# Patient Record
Sex: Female | Born: 1975 | Race: Black or African American | Hispanic: No | Smoking: Former smoker
Health system: Southern US, Community
[De-identification: ages and names within clinical notes are randomized; demographics above are authoritative.]

## PROBLEM LIST (undated history)

## (undated) DIAGNOSIS — G43909 Migraine, unspecified, not intractable, without status migrainosus: Secondary | ICD-10-CM

## (undated) DIAGNOSIS — G5600 Carpal tunnel syndrome, unspecified upper limb: Secondary | ICD-10-CM

## (undated) DIAGNOSIS — Z524 Kidney donor: Secondary | ICD-10-CM

## (undated) DIAGNOSIS — F32A Depression, unspecified: Secondary | ICD-10-CM

## (undated) DIAGNOSIS — I890 Lymphedema, not elsewhere classified: Secondary | ICD-10-CM

## (undated) DIAGNOSIS — C50919 Malignant neoplasm of unspecified site of unspecified female breast: Secondary | ICD-10-CM

## (undated) DIAGNOSIS — G4700 Insomnia, unspecified: Secondary | ICD-10-CM

## (undated) DIAGNOSIS — T7840XA Allergy, unspecified, initial encounter: Secondary | ICD-10-CM

## (undated) DIAGNOSIS — F329 Major depressive disorder, single episode, unspecified: Secondary | ICD-10-CM

## (undated) DIAGNOSIS — Z1379 Encounter for other screening for genetic and chromosomal anomalies: Secondary | ICD-10-CM

## (undated) DIAGNOSIS — F419 Anxiety disorder, unspecified: Secondary | ICD-10-CM

## (undated) HISTORY — PX: KIDNEY DONATION: SHX685

## (undated) HISTORY — DX: Migraine, unspecified, not intractable, without status migrainosus: G43.909

## (undated) HISTORY — PX: COLONOSCOPY: SHX174

## (undated) HISTORY — DX: Encounter for other screening for genetic and chromosomal anomalies: Z13.79

## (undated) HISTORY — DX: Insomnia, unspecified: G47.00

---

## 1998-04-29 ENCOUNTER — Other Ambulatory Visit: Admission: RE | Admit: 1998-04-29 | Discharge: 1998-04-29 | Payer: Self-pay | Admitting: Oral Surgery

## 1999-07-15 ENCOUNTER — Other Ambulatory Visit: Admission: RE | Admit: 1999-07-15 | Discharge: 1999-07-15 | Payer: Self-pay | Admitting: *Deleted

## 2000-10-03 ENCOUNTER — Other Ambulatory Visit: Admission: RE | Admit: 2000-10-03 | Discharge: 2000-10-03 | Payer: Self-pay | Admitting: Obstetrics and Gynecology

## 2003-07-11 HISTORY — PX: KIDNEY DONATION: SHX685

## 2008-02-03 ENCOUNTER — Encounter: Admission: RE | Admit: 2008-02-03 | Discharge: 2008-02-03 | Payer: Self-pay | Admitting: Family Medicine

## 2008-07-16 ENCOUNTER — Encounter: Admission: RE | Admit: 2008-07-16 | Discharge: 2008-07-16 | Payer: Self-pay | Admitting: Family Medicine

## 2008-08-26 ENCOUNTER — Encounter: Admission: RE | Admit: 2008-08-26 | Discharge: 2008-08-26 | Payer: Self-pay | Admitting: Family Medicine

## 2008-09-17 ENCOUNTER — Emergency Department (HOSPITAL_COMMUNITY): Admission: EM | Admit: 2008-09-17 | Discharge: 2008-09-17 | Payer: Self-pay | Admitting: Family Medicine

## 2008-09-20 ENCOUNTER — Emergency Department (HOSPITAL_COMMUNITY): Admission: EM | Admit: 2008-09-20 | Discharge: 2008-09-20 | Payer: Self-pay | Admitting: Family Medicine

## 2009-02-02 ENCOUNTER — Encounter: Admission: RE | Admit: 2009-02-02 | Discharge: 2009-02-02 | Payer: Self-pay | Admitting: Family Medicine

## 2012-02-20 ENCOUNTER — Encounter: Payer: Self-pay | Admitting: Obstetrics and Gynecology

## 2012-02-20 ENCOUNTER — Ambulatory Visit (INDEPENDENT_AMBULATORY_CARE_PROVIDER_SITE_OTHER): Payer: 59 | Admitting: Obstetrics and Gynecology

## 2012-02-20 VITALS — BP 110/78 | Wt 153.0 lb

## 2012-02-20 DIAGNOSIS — Z309 Encounter for contraceptive management, unspecified: Secondary | ICD-10-CM

## 2012-02-20 DIAGNOSIS — N898 Other specified noninflammatory disorders of vagina: Secondary | ICD-10-CM

## 2012-02-20 DIAGNOSIS — IMO0001 Reserved for inherently not codable concepts without codable children: Secondary | ICD-10-CM

## 2012-02-20 LAB — POCT WET PREP (WET MOUNT)
Clue Cells Wet Prep Whiff POC: NEGATIVE
pH: 4.5

## 2012-02-20 MED ORDER — FLUCONAZOLE 150 MG PO TABS: 150.0000 mg | ORAL_TABLET | Freq: Every day | ORAL | Status: AC

## 2012-02-20 MED ORDER — METRONIDAZOLE 500 MG PO TABS
500.0000 mg | ORAL_TABLET | Freq: Two times a day (BID) | ORAL | Status: AC
Start: 2012-02-20 — End: 2012-03-01

## 2012-02-20 MED ORDER — ETONOGESTREL-ETHINYL ESTRADIOL 0.12-0.015 MG/24HR VA RING: VAGINAL_RING | VAGINAL | Status: DC

## 2012-02-20 NOTE — Progress Notes (Signed)
Color: creamy   Odor: yes Itching:no Thin:yes Thick:yes Fever:no Dyspareunia:no Hx PID:no HX STD:yes Pelvic Pain:no Desires Gc/CT:yes Desires HIV,RPR,HbsAG:yes  HISTORY OF PRESENT ILLNESS  Kimberly Maldonado is a 36 y.o. year old female,G3P1021, who presents for a problem visit. She was to rule out sexual transmitted infections.  She uses the NuvaRing for contraception.  She says that she frequently gets yeast infections after antibiotics.  Subjective:  The patient complains of a vaginal discharge with an odor.  Objective:  BP 110/78  Wt 153 lb (69.4 kg)  LMP 02/07/2012   General: no distress GI: soft and nontender, no masses  External genitalia: normal general appearance Vaginal: normal without tenderness, induration or masses Cervix: normal appearance Adnexa: normal bimanual exam Uterus: upper limits normal size  Wet prep: PH 4.5, questionable clue cells,whiff negative  Assessment:  Vaginal discharge with an odor Contraception  Plan:  Diflucan 150 mg (patient's request). Metronidazole 500 mg twice each day for 7 days NuvaRing until annual exam GC and Chlamydia sent (patient declines blood work)  Return to office in 2 month(s) for her annual exam.   Leonard Schwartz M.D.  02/20/2012 6:24 PM

## 2012-02-21 ENCOUNTER — Encounter: Payer: Self-pay | Admitting: Obstetrics and Gynecology

## 2012-02-21 LAB — GC/CHLAMYDIA PROBE AMP, GENITAL
Chlamydia, DNA Probe: NEGATIVE
GC Probe Amp, Genital: NEGATIVE

## 2012-02-28 ENCOUNTER — Telehealth: Payer: Self-pay | Admitting: Obstetrics and Gynecology

## 2012-02-29 ENCOUNTER — Ambulatory Visit (INDEPENDENT_AMBULATORY_CARE_PROVIDER_SITE_OTHER): Payer: 59 | Admitting: Obstetrics and Gynecology

## 2012-02-29 ENCOUNTER — Encounter: Payer: Self-pay | Admitting: Obstetrics and Gynecology

## 2012-02-29 VITALS — BP 100/78 | Wt 149.0 lb

## 2012-02-29 DIAGNOSIS — F439 Reaction to severe stress, unspecified: Secondary | ICD-10-CM | POA: Insufficient documentation

## 2012-02-29 DIAGNOSIS — Z639 Problem related to primary support group, unspecified: Secondary | ICD-10-CM

## 2012-02-29 DIAGNOSIS — N898 Other specified noninflammatory disorders of vagina: Secondary | ICD-10-CM

## 2012-02-29 MED ORDER — AMBULATORY NON FORMULARY MEDICATION: 600.0000 mg | Status: DC

## 2012-02-29 NOTE — Progress Notes (Signed)
Color: creamy white Odor: no Itching:no Thin:no Thick:yes Fever:no Dyspareunia:yes Hx PID:no HX STD:no Pelvic Pain:no Desires Gc/CT:no Desires HIV,RPR,HbsAG:no  HISTORY OF PRESENT ILLNESS  Ms. Kimberly Maldonado is a 36 y.o. year old female,G3P1021, who presents for a problem visit. Gonorrhea and Chlamydia were negative recently.  Subjective:  The patient complains of continued vaginal discharge, dyspareunia, and emotional distress because her husband has left home.  Objective:  BP 100/78  Wt 149 lb (67.586 kg)  LMP 02/07/2012   General: moderate distress Resp: clear to auscultation bilaterally  External genitalia: normal general appearance Vaginal: normal without tenderness, induration or masses and NuvaRing present.  Thin discharge only. Cervix: normal appearance Adnexa: normal bimanual exam Uterus: upper limits normal size  Ossum test is negative for yeast, vaginosis, and trichomoniasis.  Assessment:  Vaginal discharge of uncertain etiology. Stress associated with separation from husband. Dyspareunia.  Plan:  Boric Acid suppositories 600 mg twice each week. Support offered. Counseling recommended.  Return to office prn if symptoms worsen or fail to improve.   Leonard Schwartz M.D.  02/29/2012 6:30 PM

## 2012-03-06 ENCOUNTER — Ambulatory Visit: Payer: 59 | Admitting: Obstetrics and Gynecology

## 2012-08-12 ENCOUNTER — Other Ambulatory Visit: Payer: Self-pay | Admitting: Obstetrics and Gynecology

## 2012-08-26 ENCOUNTER — Encounter: Payer: Self-pay | Admitting: Obstetrics and Gynecology

## 2012-08-26 ENCOUNTER — Ambulatory Visit: Payer: 59 | Admitting: Obstetrics and Gynecology

## 2012-08-26 VITALS — BP 90/62 | HR 62 | Ht 67.0 in | Wt 157.0 lb

## 2012-08-26 DIAGNOSIS — IMO0001 Reserved for inherently not codable concepts without codable children: Secondary | ICD-10-CM

## 2012-08-26 DIAGNOSIS — Z01419 Encounter for gynecological examination (general) (routine) without abnormal findings: Secondary | ICD-10-CM

## 2012-08-26 DIAGNOSIS — Z124 Encounter for screening for malignant neoplasm of cervix: Secondary | ICD-10-CM

## 2012-08-26 LAB — POCT URINE PREGNANCY: Preg Test, Ur: NEGATIVE

## 2012-08-26 MED ORDER — ETONOGESTREL-ETHINYL ESTRADIOL 0.12-0.015 MG/24HR VA RING: VAGINAL_RING | VAGINAL | Status: DC

## 2012-08-26 NOTE — Progress Notes (Signed)
Subjective:    Cotina Freedman is a 37 y.o. female, [id number], who presents for an annual exam. The patient reports no complaints but would like to return to Nuvaring.  Menstrual cycle:   LMP: Patient's last menstrual period was 08/13/2012.             Review of Systems Pertinent items are noted in HPI. Denies pelvic pain, urinary tract symptoms, vaginitis symptoms, irregular bleeding, menopausal symptoms, change in bowel habits or rectal bleeding   Objective:    BP 90/62  Pulse 62  Ht 5\' 7"  (1.702 m)  Wt 157 lb (71.215 kg)  BMI 24.58 kg/m2  LMP 08/13/2012   Wt Readings from Last 1 Encounters:  08/26/12 157 lb (71.215 kg)   Body mass index is 24.58 kg/(m^2). General Appearance: Alert, no acute distress HEENT: Grossly normal Neck / Thyroid: Supple, no thyromegaly or cervical adenopathy Lungs: Clear to auscultation bilaterally Back: No CVA tenderness Breast Exam: No masses or nodes.No dimpling, nipple retraction or discharge. Cardiovascular: Regular rate and rhythm.  Gastrointestinal: Soft, non-tender, no masses or organomegaly Pelvic Exam: EGBUS-wnl, vagina-normal rugae, cervix- without lesions or tenderness, uterus appears normal size shape and consistency, adnexae-no masses or tenderness Lymphatic Exam: Non-palpable nodes in neck, clavicular,  axillary, or inguinal regions  Skin: no rashes or abnormalities Extremities: no clubbing cyanosis or edema  Neurologic: grossly normal Psychiatric: Alert and oriented  UPT: negative    Assessment:   Routine GYN Exam   Plan:  Nuvaring #1 1 pv for 21 of 28 days 11 refills  PAP sent  RTO 1 year or prn  Jennifr Gaeta,ELMIRAPA-C

## 2012-08-26 NOTE — Progress Notes (Signed)
Regular Periods: yes Mammogram: yes  Monthly Breast Ex.: no Exercise: no  Tetanus < 10 years: yes Seatbelts: yes  NI. Bladder Functn.: yes Abuse at home: no  Daily BM's: yes Stressful Work: no  Healthy Diet: yes Sigmoid-Colonoscopy: no  Calcium: no Medical problems this year: want to get back on nuvaring; pt has missed one    LAST PAP:7/12   Ascus;negative hpv  Contraception: nuvaring  Mammogram:  2011  PCP: Dr. Alwyn Pea   PMH: no change  FMH: no change  Last Bone Scan: no  Pt is married.

## 2012-08-28 LAB — PAP IG W/ RFLX HPV ASCU

## 2013-09-01 ENCOUNTER — Other Ambulatory Visit: Payer: Self-pay | Admitting: Obstetrics and Gynecology

## 2013-09-01 ENCOUNTER — Other Ambulatory Visit: Payer: Self-pay | Admitting: Gynecology

## 2013-09-01 DIAGNOSIS — N6019 Diffuse cystic mastopathy of unspecified breast: Secondary | ICD-10-CM

## 2013-09-09 ENCOUNTER — Ambulatory Visit
Admission: RE | Admit: 2013-09-09 | Discharge: 2013-09-09 | Disposition: A | Discharge status: Home / Self Care | Payer: 59 | Source: Ambulatory Visit | Attending: Obstetrics and Gynecology | Admitting: Obstetrics and Gynecology

## 2013-09-09 ENCOUNTER — Other Ambulatory Visit: Payer: Self-pay | Admitting: Obstetrics and Gynecology

## 2013-09-09 DIAGNOSIS — N6019 Diffuse cystic mastopathy of unspecified breast: Secondary | ICD-10-CM

## 2014-05-11 ENCOUNTER — Encounter: Payer: Self-pay | Admitting: Obstetrics and Gynecology

## 2016-01-04 ENCOUNTER — Other Ambulatory Visit: Payer: Self-pay | Admitting: Otolaryngology

## 2016-01-04 DIAGNOSIS — K111 Hypertrophy of salivary gland: Secondary | ICD-10-CM

## 2016-01-12 ENCOUNTER — Ambulatory Visit
Admission: RE | Admit: 2016-01-12 | Discharge: 2016-01-12 | Disposition: A | Discharge status: Home / Self Care | Payer: 59 | Source: Ambulatory Visit | Attending: Otolaryngology | Admitting: Otolaryngology

## 2016-01-12 DIAGNOSIS — K111 Hypertrophy of salivary gland: Secondary | ICD-10-CM

## 2016-01-12 MED ORDER — IOPAMIDOL (ISOVUE-300) INJECTION 61%
75.0000 mL | Freq: Once | INTRAVENOUS | Status: AC | PRN
  Administered 2016-01-12: 75 mL via INTRAVENOUS

## 2016-02-03 ENCOUNTER — Other Ambulatory Visit: Payer: Self-pay | Admitting: Family Medicine

## 2016-02-03 DIAGNOSIS — L732 Hidradenitis suppurativa: Secondary | ICD-10-CM

## 2016-02-08 ENCOUNTER — Ambulatory Visit
Admission: RE | Admit: 2016-02-08 | Discharge: 2016-02-08 | Disposition: A | Discharge status: Home / Self Care | Payer: 59 | Source: Ambulatory Visit | Attending: Family Medicine | Admitting: Family Medicine

## 2016-02-08 ENCOUNTER — Other Ambulatory Visit: Payer: Self-pay | Admitting: Family Medicine

## 2016-02-08 DIAGNOSIS — L732 Hidradenitis suppurativa: Secondary | ICD-10-CM

## 2016-07-12 ENCOUNTER — Other Ambulatory Visit: Payer: Self-pay | Admitting: Family Medicine

## 2016-07-12 DIAGNOSIS — N63 Unspecified lump in unspecified breast: Secondary | ICD-10-CM

## 2016-07-12 DIAGNOSIS — R921 Mammographic calcification found on diagnostic imaging of breast: Secondary | ICD-10-CM

## 2016-08-21 ENCOUNTER — Ambulatory Visit
Admission: RE | Admit: 2016-08-21 | Discharge: 2016-08-21 | Disposition: A | Discharge status: Home / Self Care | Payer: 59 | Source: Ambulatory Visit | Attending: Family Medicine | Admitting: Family Medicine

## 2016-08-21 DIAGNOSIS — R921 Mammographic calcification found on diagnostic imaging of breast: Secondary | ICD-10-CM | POA: Diagnosis not present

## 2016-09-05 DIAGNOSIS — Z0000 Encounter for general adult medical examination without abnormal findings: Secondary | ICD-10-CM | POA: Diagnosis not present

## 2016-09-05 DIAGNOSIS — Z13220 Encounter for screening for lipoid disorders: Secondary | ICD-10-CM | POA: Diagnosis not present

## 2016-09-25 DIAGNOSIS — Z6824 Body mass index (BMI) 24.0-24.9, adult: Secondary | ICD-10-CM | POA: Diagnosis not present

## 2016-09-25 DIAGNOSIS — Z124 Encounter for screening for malignant neoplasm of cervix: Secondary | ICD-10-CM | POA: Diagnosis not present

## 2016-09-25 DIAGNOSIS — Z01419 Encounter for gynecological examination (general) (routine) without abnormal findings: Secondary | ICD-10-CM | POA: Diagnosis not present

## 2016-10-11 ENCOUNTER — Other Ambulatory Visit: Payer: Self-pay | Admitting: General Surgery

## 2016-10-11 DIAGNOSIS — N6310 Unspecified lump in the right breast, unspecified quadrant: Secondary | ICD-10-CM | POA: Diagnosis not present

## 2016-11-07 NOTE — H&P (Signed)
Kimberly Maldonado Location: [facility] Patient #: [id number] DOB: [birth year] Single / Language: Kimberly Maldonado / Race: Black or African American Female   History of Present Illness Kimberly Klein MD; 11/02/2016 12:36 AM) The patient is a 41 year old female who presents with a breast mass. Pt is a 41 yo F referred for consultation by Kimberly Regal, PA for a persistent right breast mass. The patient has had some pain on and off for several months. She denies drainage. She has had an abnormal area in her breast since last summer. This has not shown up on mammogram. She has a family history of breast cancer in her maternal aunt. She is concerned about this.    dx right mammogram 08/27/2016 FINDINGS: Mammographically, there are no suspicious masses, areas of architectural distortion or new groups of calcifications. There is a stable 13 mm group of calcifications in the right breast upper outer quadrant, middle depth.  Mammographic images were processed with CAD.  dx mammogram bilateral 02/2016 FINDINGS: In the lateral aspect of the right breast there is a 1.3 cm group of calcifications which appear amorphous on the CC and two of which appear curvilinear on the true lateral view suggesting possible milk of calcium. Spot compression tomosynthesis images of the bilateral axillary demonstrates normal-appearing lymph nodes without suspicious mass. No suspicious calcifications, masses or areas of distortion are seen in the bilateral breasts.  Mammographic images were processed with CAD.  Ultrasound of the bilateral axilla demonstrates multiple normal-appearing lymph nodes. No suspicious masses, abnormal lymph nodes or suspicious areas of shadowing are identified.  IMPRESSION: 1. Normal-appearing lymph nodes correspond with the palpable areas of concern in the bilateral axilla.  2. The calcifications in the lateral right breast appear probably benign. These are favored to represent  milk of calcium.  3. No mammographic evidence of malignancy in the bilateral breasts.  RECOMMENDATION: 1. Six-month follow-up diagnostic right breast mammogram is recommended to ensure stability of the probably benign calcifications.  2. Clinical follow-up recommended for the palpable areas of concern in the bilateral axilla. Any further workup should be based on clinical grounds.  I have discussed the findings and recommendations with the patient. Results were also provided in writing at the conclusion of the visit. If applicable, a reminder letter will be sent to the patient regarding the next appointment.  BI-RADS CATEGORY 3: Probably benign.   IMPRESSION: Stable probably benign right breast calcifications, for which continued six-month follow-up is recommended.  RECOMMENDATION: Diagnostic mammogram of the right breast in 6 months. (Code:[id number])  I have discussed the findings and recommendations with the patient. Results were also provided in writing at the conclusion of the visit. If applicable, a reminder letter will be sent to the patient regarding the next appointment.  BI-RADS CATEGORY 3: Probably benign.   Past Surgical History Nephrectomy  Left.  Diagnostic Studies History Colonoscopy  1-5 years ago Mammogram  within last year Pap Smear  1-5 years ago  Allergies  No Known Allergies  Medication History  Zolpidem Tartrate (10MG  Tablet, Oral half tab) Active. Cymbalta (20MG  Capsule DR Part, Oral) Active. TraZODone HCl (50MG  Tablet, Oral) Active. NuvaRing (0.12-0.015MG /24HR Ring, Vaginal) Active. Medications Reconciled  Social History Alcohol use  Recently quit alcohol use. No caffeine use  No drug use  Tobacco use  Former smoker.  Family History Alcohol Abuse  Brother. Diabetes Mellitus  Father. Hypertension  Father.  Pregnancy / Birth History Age at menarche  76 years. Contraceptive History  Depo-provera. Kimberly Maldonado Age  2 Length (months) of breastfeeding  3-6 Maternal age  5-20 Para  1 Regular periods   Other Problems Anxiety Disorder  Back Pain  Depression  Lump In Breast     Review of Systems  General Not Present- Appetite Loss, Chills, Fatigue, Fever, Night Sweats, Weight Gain and Weight Loss. Skin Not Present- Change in Wart/Mole, Dryness, Hives, Jaundice, New Lesions, Non-Healing Wounds, Rash and Ulcer. HEENT Present- Seasonal Allergies. Not Present- Earache, Hearing Loss, Hoarseness, Nose Bleed, Oral Ulcers, Ringing in the Ears, Sinus Pain, Sore Throat, Visual Disturbances, Wears glasses/contact lenses and Yellow Eyes. Respiratory Not Present- Bloody sputum, Chronic Cough, Difficulty Breathing, Snoring and Wheezing. Breast Present- Breast Mass. Not Present- Breast Pain, Nipple Discharge and Skin Changes. Cardiovascular Not Present- Chest Pain, Difficulty Breathing Lying Down, Leg Cramps, Palpitations, Rapid Heart Rate, Shortness of Breath and Swelling of Extremities. Gastrointestinal Not Present- Abdominal Pain, Bloating, Bloody Stool, Change in Bowel Habits, Chronic diarrhea, Constipation, Difficulty Swallowing, Excessive gas, Gets full quickly at meals, Hemorrhoids, Indigestion, Nausea, Rectal Pain and Vomiting. Female Genitourinary Not Present- Frequency, Nocturia, Painful Urination, Pelvic Pain and Urgency. Musculoskeletal Not Present- Back Pain, Joint Pain, Joint Stiffness, Muscle Pain, Muscle Weakness and Swelling of Extremities. Neurological Not Present- Decreased Memory, Fainting, Headaches, Numbness, Seizures, Tingling, Tremor, Trouble walking and Weakness. Psychiatric Present- Depression. Not Present- Anxiety, Bipolar, Change in Sleep Pattern, Fearful and Frequent crying. Endocrine Not Present- Cold Intolerance, Excessive Hunger, Hair Changes, Heat Intolerance, Hot flashes and New Diabetes. Hematology Not Present- Blood Thinners, Easy Bruising, Excessive bleeding, Gland problems,  HIV and Persistent Infections.  Vitals Kimberly Maldonado RMA;  Weight: 168.2 lb Height: 69in Body Surface Area: 1.92 m Body Mass Index: 24.84 kg/m  Temp.: 98.32F  Pulse: 70 (Regular)  BP: 128/90 (Sitting, Left Arm, Standard)   Physical Exam  General Mental Status-Alert. General Appearance-Consistent with stated age. Hydration-Well hydrated. Voice-Normal.  Head and Neck Head-normocephalic, atraumatic with no lesions or palpable masses. Trachea-midline. Thyroid Gland Characteristics - normal size and consistency.  Eye Eyeball - Bilateral-Extraocular movements intact. Sclera/Conjunctiva - Bilateral-No scleral icterus.  Chest and Lung Exam Chest and lung exam reveals -quiet, even and easy respiratory effort with no use of accessory muscles and on auscultation, normal breath sounds, no adventitious sounds and normal vocal resonance. Inspection Chest Wall - Normal. Back - normal.  Breast Note: 1-2 cm mass at 7 o'clock on the right breast. relatively dense breast tissue bilaterally. no nipple retraction or skin dimpling. No LAD. no other masses found.   Cardiovascular Cardiovascular examination reveals -normal heart sounds, regular rate and rhythm with no murmurs and normal pedal pulses bilaterally.  Abdomen Inspection Inspection of the abdomen reveals - No Hernias. Palpation/Percussion Palpation and Percussion of the abdomen reveal - Soft, Non Tender, No Rebound tenderness, No Rigidity (guarding) and No hepatosplenomegaly. Auscultation Auscultation of the abdomen reveals - Bowel sounds normal.  Neurologic Neurologic evaluation reveals -alert and oriented x 3 with no impairment of recent or remote memory. Mental Status-Normal.  Musculoskeletal Global Assessment -Note: no gross deformities.  Normal Exam - Left-Upper Extremity Strength Normal and Lower Extremity Strength Normal. Normal Exam - Right-Upper Extremity Strength  Normal and Lower Extremity Strength Normal.  Lymphatic Head & Neck  General Head & Neck Lymphatics: Bilateral - Description - Normal. Axillary  General Axillary Region: Bilateral - Description - Normal. Tenderness - Non Tender. Femoral & Inguinal  Generalized Femoral & Inguinal Lymphatics: Bilateral - Description - No Generalized lymphadenopathy.    Assessment & Plan Kimberly Klein MD; 11/02/2016 12:38  AM) BREAST MASS, RIGHT (N63.10) Impression: Pt has persistent mass on right breast without imaging correlate. Need tissue diagnosis.  Will plan excisional breast biopsy.  The surgical procedure was described to the patient. I discussed the incision type and location and that we would need radiology involved on with a wire or seed marker and/or sentinel node.  The risks and benefits of the procedure were described to the patient and she wishes to proceed.  We discussed the risks bleeding, infection, damage to other structures, need for further procedures/surgeries. We discussed the risk of seroma. The patient was advised if the area in the breast in cancer, we may need to go back to surgery for additional tissue to obtain negative margins or for a lymph node biopsy. The patient was advised that these are the most common complications, but that others can occur as well. They were advised against taking aspirin or other anti-inflammatory agents/blood thinners the week before surgery. Current Plans You are being scheduled for surgery- Our schedulers will call you.  You should hear from our office's scheduling department within 5 working days about the location, date, and time of surgery. We try to make accommodations for patient's preferences in scheduling surgery, but sometimes the OR schedule or the surgeon's schedule prevents [place] from making those accommodations.  If you have not heard from our office [phone]) in 5 working days, call the office and ask for your surgeon's  nurse.  If you have other questions about your diagnosis, plan, or surgery, call the office and ask for your surgeon's nurse.  Pt Education - [facility] Breast Biopsy HCI: discussed with patient and provided information.   Signed by Kimberly Klein, MD (11/02/2016 12:39 AM)

## 2016-11-08 ENCOUNTER — Encounter (HOSPITAL_COMMUNITY): Payer: Self-pay

## 2016-11-08 ENCOUNTER — Encounter (HOSPITAL_COMMUNITY)
Admission: RE | Admit: 2016-11-08 | Discharge: 2016-11-08 | Disposition: A | Discharge status: Home / Self Care | Payer: 59 | Source: Ambulatory Visit | Attending: General Surgery | Admitting: General Surgery

## 2016-11-08 DIAGNOSIS — Z01812 Encounter for preprocedural laboratory examination: Secondary | ICD-10-CM | POA: Diagnosis not present

## 2016-11-08 DIAGNOSIS — N6310 Unspecified lump in the right breast, unspecified quadrant: Secondary | ICD-10-CM | POA: Diagnosis not present

## 2016-11-08 HISTORY — DX: Allergy, unspecified, initial encounter: T78.40XA

## 2016-11-08 HISTORY — DX: Carpal tunnel syndrome, unspecified upper limb: G56.00

## 2016-11-08 HISTORY — DX: Major depressive disorder, single episode, unspecified: F32.9

## 2016-11-08 HISTORY — DX: Depression, unspecified: F32.A

## 2016-11-08 LAB — CBC
HCT: 38 % (ref 36.0–46.0)
Hemoglobin: 12.6 g/dL (ref 12.0–15.0)
MCH: 29.8 pg (ref 26.0–34.0)
MCHC: 33.2 g/dL (ref 30.0–36.0)
MCV: 89.8 fL (ref 78.0–100.0)
Platelets: 208 10*3/uL (ref 150–400)
RBC: 4.23 MIL/uL (ref 3.87–5.11)
RDW: 14.5 % (ref 11.5–15.5)
WBC: 4.4 10*3/uL (ref 4.0–10.5)

## 2016-11-08 LAB — BASIC METABOLIC PANEL
Anion gap: 11 (ref 5–15)
BUN: 15 mg/dL (ref 6–20)
CO2: 17 mmol/L — ABNORMAL LOW (ref 22–32)
Calcium: 8.8 mg/dL — ABNORMAL LOW (ref 8.9–10.3)
Chloride: 108 mmol/L (ref 101–111)
Creatinine, Ser: 1.01 mg/dL — ABNORMAL HIGH (ref 0.44–1.00)
GFR calc Af Amer: >60 mL/min (ref >60)
GFR calc non Af Amer: >60 mL/min (ref >60)
Glucose, Bld: 130 mg/dL — ABNORMAL HIGH (ref 65–99)
Potassium: 3.7 mmol/L (ref 3.5–5.1)
Sodium: 136 mmol/L (ref 135–145)

## 2016-11-08 LAB — HCG, SERUM, QUALITATIVE: Preg, Serum: NEGATIVE

## 2016-11-08 MED ORDER — CHLORHEXIDINE GLUCONATE CLOTH 2 % EX PADS: 6.0000 | MEDICATED_PAD | Freq: Once | CUTANEOUS | Status: DC

## 2016-11-08 NOTE — Pre-Procedure Instructions (Signed)
Kimberly Maldonado  11/08/2016      [id number] [name] [name] - [address] [place] [address] Phone: [phone] Fax: [phone]  [id number] - [place], [place] [address] Phone: [phone] Fax: [phone]    Your procedure is scheduled on Wed, May 9 @ 11:00 AM  Report to [name] at 9:00 AM  Call this number if you have problems the morning of surgery:  [phone]   Remember:  Do not eat food or drink liquids after midnight.  Take these medicines the morning of surgery with A SIP OF WATER Zyrtec(Cetirizine),Cymbalta(Duloxetine), and Singulair(Montelukast)              Stop taking your Multivitamin now along with any other Vitamins or Herbal Medications. No Goody's,BC's,Aleve,Advil,Motrin,Ibuprofen,Aspirin,or Fish Oil.    Do not wear jewelry, make-up or nail polish.  Do not wear lotions, powders,perfumes, or deoderant.  Do not shave 48 hours prior to surgery.   Do not bring valuables to the hospital.  [facility] is not responsible for any belongings or valuables.  Contacts, dentures or bridgework may not be worn into surgery.  Leave your suitcase in the car.  After surgery it may be brought to your room.  For patients admitted to the hospital, discharge time will be determined by your treatment team.  Patients discharged the day of surgery will not be allowed to drive home.    [facility]  Before surgery, you can play an important role.  Because skin is not sterile, your skin needs to be as free of germs as possible.  You can reduce the number of germs on you skin by washing with CHG (chlorahexidine gluconate) soap before surgery.  CHG is an antiseptic cleaner which kills germs and bonds with the skin to continue killing germs even after washing.  Please DO NOT use if you have an allergy to CHG or  antibacterial soaps.  If your skin becomes reddened/irritated stop using the CHG and inform your nurse when you arrive at Short Stay.  Do not shave (including legs and underarms) for at least 48 hours prior to the first CHG shower.  You may shave your face.  Please follow these instructions carefully:   1.  Shower with CHG Soap the night before surgery and the                                morning of Surgery.  2.  If you choose to wash your hair, wash your hair first as usual with your       normal shampoo.  3.  After you shampoo, rinse your hair and body thoroughly to remove the                      Shampoo.  4.  Use CHG as you would any other liquid soap.  You can apply chg directly       to the skin and wash gently with scrungie or a clean washcloth.  5.  Apply the CHG Soap to your body ONLY FROM THE NECK DOWN.        Do not use on open wounds or open sores.  Avoid contact with your eyes,       ears, mouth and genitals (private parts).  Wash genitals (private  parts)       with your normal soap.  6.  Wash thoroughly, paying special attention to the area where your surgery        will be performed.  7.  Thoroughly rinse your body with warm water from the neck down.  8.  DO NOT shower/wash with your normal soap after using and rinsing off       the CHG Soap.  9.  Pat yourself dry with a clean towel.            10.  Wear clean pajamas.            11.  Place clean sheets on your bed the night of your first shower and do not        sleep with pets.  Day of Surgery  Do not apply any lotions/deoderants the morning of surgery.  Please wear clean clothes to the hospital/surgery center.    Please read over the following fact sheets that you were given. Pain Booklet, Coughing and Deep Breathing and Surgical Site Infection Prevention

## 2016-11-08 NOTE — Progress Notes (Signed)
Cardiologist denies  Medical Md is Dr.Victoria Rankins  Echo/stress test/heart cath denies  EKG and CXR denies in past yr

## 2016-11-15 ENCOUNTER — Ambulatory Visit (HOSPITAL_COMMUNITY)
Admission: RE | Admit: 2016-11-15 | Discharge: 2016-11-15 | Disposition: A | Discharge status: Home / Self Care | Payer: 59 | Source: Ambulatory Visit | Attending: General Surgery | Admitting: General Surgery

## 2016-11-15 ENCOUNTER — Ambulatory Visit (HOSPITAL_COMMUNITY): Payer: 59 | Admitting: Anesthesiology

## 2016-11-15 ENCOUNTER — Encounter (HOSPITAL_COMMUNITY): Payer: Self-pay | Admitting: *Deleted

## 2016-11-15 ENCOUNTER — Encounter (HOSPITAL_COMMUNITY)
Admission: RE | Disposition: A | Discharge status: Home / Self Care | Payer: Self-pay | Source: Ambulatory Visit | Attending: General Surgery

## 2016-11-15 DIAGNOSIS — Z87891 Personal history of nicotine dependence: Secondary | ICD-10-CM | POA: Insufficient documentation

## 2016-11-15 DIAGNOSIS — C50411 Malignant neoplasm of upper-outer quadrant of right female breast: Secondary | ICD-10-CM | POA: Diagnosis not present

## 2016-11-15 DIAGNOSIS — F329 Major depressive disorder, single episode, unspecified: Secondary | ICD-10-CM | POA: Insufficient documentation

## 2016-11-15 DIAGNOSIS — F419 Anxiety disorder, unspecified: Secondary | ICD-10-CM | POA: Insufficient documentation

## 2016-11-15 DIAGNOSIS — Z79899 Other long term (current) drug therapy: Secondary | ICD-10-CM | POA: Diagnosis not present

## 2016-11-15 DIAGNOSIS — C50919 Malignant neoplasm of unspecified site of unspecified female breast: Secondary | ICD-10-CM

## 2016-11-15 DIAGNOSIS — C50911 Malignant neoplasm of unspecified site of right female breast: Secondary | ICD-10-CM | POA: Insufficient documentation

## 2016-11-15 DIAGNOSIS — N6310 Unspecified lump in the right breast, unspecified quadrant: Secondary | ICD-10-CM | POA: Diagnosis not present

## 2016-11-15 HISTORY — PX: BREAST BIOPSY: SHX20

## 2016-11-15 HISTORY — DX: Malignant neoplasm of unspecified site of unspecified female breast: C50.919

## 2016-11-15 HISTORY — PX: BREAST LUMPECTOMY: SHX2

## 2016-11-15 SURGERY — BREAST BIOPSY
Anesthesia: General | Site: Breast | Laterality: Right

## 2016-11-15 MED ORDER — LIDOCAINE HCL 1 % IJ SOLN
INTRAMUSCULAR | Status: DC | PRN
  Administered 2016-11-15: 20 mL

## 2016-11-15 MED ORDER — LIDOCAINE 2% (20 MG/ML) 5 ML SYRINGE
INTRAMUSCULAR | Status: AC
  Filled 2016-11-15: qty 5

## 2016-11-15 MED ORDER — DEXAMETHASONE SODIUM PHOSPHATE 10 MG/ML IJ SOLN
INTRAMUSCULAR | Status: AC
  Filled 2016-11-15: qty 1

## 2016-11-15 MED ORDER — OXYCODONE HCL 5 MG PO TABS
ORAL_TABLET | ORAL | Status: AC
  Filled 2016-11-15: qty 1

## 2016-11-15 MED ORDER — LIDOCAINE HCL (CARDIAC) 20 MG/ML IV SOLN
INTRAVENOUS | Status: DC | PRN
  Administered 2016-11-15: 60 mg via INTRAVENOUS

## 2016-11-15 MED ORDER — LACTATED RINGERS IV SOLN
INTRAVENOUS | Status: DC
  Administered 2016-11-15: 10:00:00 via INTRAVENOUS

## 2016-11-15 MED ORDER — FENTANYL CITRATE (PF) 100 MCG/2ML IJ SOLN
INTRAMUSCULAR | Status: AC
  Filled 2016-11-15: qty 2

## 2016-11-15 MED ORDER — FENTANYL CITRATE (PF) 250 MCG/5ML IJ SOLN
INTRAMUSCULAR | Status: AC
  Filled 2016-11-15: qty 5

## 2016-11-15 MED ORDER — OXYCODONE HCL 5 MG PO TABS: 5.0000 mg | ORAL_TABLET | Freq: Four times a day (QID) | ORAL | 0 refills | Status: DC | PRN

## 2016-11-15 MED ORDER — CEFAZOLIN SODIUM-DEXTROSE 2-4 GM/100ML-% IV SOLN
2.0000 g | INTRAVENOUS | Status: AC
  Administered 2016-11-15: 2 g via INTRAVENOUS

## 2016-11-15 MED ORDER — OXYCODONE HCL 5 MG PO TABS
5.0000 mg | ORAL_TABLET | Freq: Once | ORAL | Status: AC | PRN
  Administered 2016-11-15: 5 mg via ORAL

## 2016-11-15 MED ORDER — BUPIVACAINE HCL 0.25 % IJ SOLN
INTRAMUSCULAR | Status: DC | PRN
  Administered 2016-11-15: 20 mL

## 2016-11-15 MED ORDER — OXYCODONE HCL 5 MG/5ML PO SOLN: 5.0000 mg | Freq: Once | ORAL | Status: AC | PRN

## 2016-11-15 MED ORDER — ONDANSETRON HCL 4 MG/2ML IJ SOLN
INTRAMUSCULAR | Status: DC | PRN
  Administered 2016-11-15: 4 mg via INTRAVENOUS

## 2016-11-15 MED ORDER — FENTANYL CITRATE (PF) 100 MCG/2ML IJ SOLN
INTRAMUSCULAR | Status: DC | PRN
  Administered 2016-11-15 (×2): 50 ug via INTRAVENOUS

## 2016-11-15 MED ORDER — ONDANSETRON HCL 4 MG/2ML IJ SOLN
INTRAMUSCULAR | Status: AC
  Filled 2016-11-15: qty 2

## 2016-11-15 MED ORDER — CEFAZOLIN SODIUM-DEXTROSE 2-4 GM/100ML-% IV SOLN
INTRAVENOUS | Status: AC
  Filled 2016-11-15: qty 100

## 2016-11-15 MED ORDER — DEXAMETHASONE SODIUM PHOSPHATE 10 MG/ML IJ SOLN
INTRAMUSCULAR | Status: DC | PRN
  Administered 2016-11-15: 10 mg via INTRAVENOUS

## 2016-11-15 MED ORDER — PROPOFOL 10 MG/ML IV BOLUS
INTRAVENOUS | Status: DC | PRN
  Administered 2016-11-15: 40 mg via INTRAVENOUS
  Administered 2016-11-15: 160 mg via INTRAVENOUS

## 2016-11-15 MED ORDER — MIDAZOLAM HCL 2 MG/2ML IJ SOLN
INTRAMUSCULAR | Status: AC
  Filled 2016-11-15: qty 2

## 2016-11-15 MED ORDER — PROPOFOL 10 MG/ML IV BOLUS
INTRAVENOUS | Status: AC
  Filled 2016-11-15: qty 20

## 2016-11-15 MED ORDER — BUPIVACAINE HCL (PF) 0.25 % IJ SOLN
INTRAMUSCULAR | Status: AC
Start: 2016-11-15 — End: ?
  Filled 2016-11-15: qty 30

## 2016-11-15 MED ORDER — IBUPROFEN 600 MG PO TABS: 600.0000 mg | ORAL_TABLET | Freq: Four times a day (QID) | ORAL | 1 refills | Status: DC | PRN

## 2016-11-15 MED ORDER — LIDOCAINE HCL 1 % IJ SOLN
INTRAMUSCULAR | Status: AC
  Filled 2016-11-15: qty 20

## 2016-11-15 MED ORDER — 0.9 % SODIUM CHLORIDE (POUR BTL) OPTIME
TOPICAL | Status: DC | PRN
  Administered 2016-11-15: 1000 mL

## 2016-11-15 MED ORDER — MIDAZOLAM HCL 5 MG/5ML IJ SOLN
INTRAMUSCULAR | Status: DC | PRN
  Administered 2016-11-15: 2 mg via INTRAVENOUS

## 2016-11-15 MED ORDER — FENTANYL CITRATE (PF) 100 MCG/2ML IJ SOLN
25.0000 ug | INTRAMUSCULAR | Status: DC | PRN
Start: 2016-11-15 — End: 2016-11-15
  Administered 2016-11-15 (×3): 50 ug via INTRAVENOUS

## 2016-11-15 MED ORDER — PROPOFOL 10 MG/ML IV BOLUS
INTRAVENOUS | Status: AC
Start: 2016-11-15 — End: ?
  Filled 2016-11-15: qty 20

## 2016-11-15 SURGICAL SUPPLY — 51 items
ADH SKN CLS APL DERMABOND .7 (GAUZE/BANDAGES/DRESSINGS) ×1
BINDER BREAST LRG (GAUZE/BANDAGES/DRESSINGS) IMPLANT
BINDER BREAST XLRG (GAUZE/BANDAGES/DRESSINGS) ×1 IMPLANT
BLADE SURG 10 STRL SS (BLADE) ×2 IMPLANT
CANISTER SUCT 3000ML PPV (MISCELLANEOUS) ×2 IMPLANT
CHLORAPREP W/TINT 26ML (MISCELLANEOUS) ×2 IMPLANT
CLIP TI MEDIUM 6 (CLIP) ×2 IMPLANT
CLIP TI WIDE RED SMALL 6 (CLIP) ×1 IMPLANT
CONT SPEC 4OZ CLIKSEAL STRL BL (MISCELLANEOUS) IMPLANT
COVER SURGICAL LIGHT HANDLE (MISCELLANEOUS) ×2 IMPLANT
DECANTER SPIKE VIAL GLASS SM (MISCELLANEOUS) ×4 IMPLANT
DERMABOND ADVANCED (GAUZE/BANDAGES/DRESSINGS) ×1
DERMABOND ADVANCED .7 DNX12 (GAUZE/BANDAGES/DRESSINGS) IMPLANT
DEVICE DUBIN SPECIMEN MAMMOGRA (MISCELLANEOUS) IMPLANT
DRAPE LAPAROTOMY 100X72 PEDS (DRAPES) ×2 IMPLANT
DRAPE UTILITY XL STRL (DRAPES) ×4 IMPLANT
DRSG PAD ABDOMINAL 8X10 ST (GAUZE/BANDAGES/DRESSINGS) ×1 IMPLANT
ELECT CAUTERY BLADE 6.4 (BLADE) ×2 IMPLANT
ELECT REM PT RETURN 9FT ADLT (ELECTROSURGICAL) ×2
ELECTRODE REM PT RTRN 9FT ADLT (ELECTROSURGICAL) ×1 IMPLANT
GAUZE SPONGE 4X4 12PLY STRL (GAUZE/BANDAGES/DRESSINGS) ×2 IMPLANT
GLOVE BIO SURGEON STRL SZ 6 (GLOVE) ×2 IMPLANT
GLOVE BIOGEL PI IND STRL 6.5 (GLOVE) ×1 IMPLANT
GLOVE BIOGEL PI INDICATOR 6.5 (GLOVE) ×1
GOWN STRL REUS W/ TWL LRG LVL3 (GOWN DISPOSABLE) ×1 IMPLANT
GOWN STRL REUS W/TWL 2XL LVL3 (GOWN DISPOSABLE) ×4 IMPLANT
GOWN STRL REUS W/TWL LRG LVL3 (GOWN DISPOSABLE) ×2
ILLUMINATOR WAVEGUIDE N/F (MISCELLANEOUS) IMPLANT
KIT BASIN OR (CUSTOM PROCEDURE TRAY) ×2 IMPLANT
KIT MARKER MARGIN INK (KITS) IMPLANT
KIT ROOM TURNOVER OR (KITS) ×2 IMPLANT
LIGHT WAVEGUIDE WIDE FLAT (MISCELLANEOUS) IMPLANT
NDL HYPO 25GX1X1/2 BEV (NEEDLE) ×1 IMPLANT
NEEDLE HYPO 25GX1X1/2 BEV (NEEDLE) ×2 IMPLANT
NS IRRIG 1000ML POUR BTL (IV SOLUTION) ×2 IMPLANT
PACK SURGICAL SETUP 50X90 (CUSTOM PROCEDURE TRAY) ×2 IMPLANT
PAD ARMBOARD 7.5X6 YLW CONV (MISCELLANEOUS) ×4 IMPLANT
PENCIL BUTTON HOLSTER BLD 10FT (ELECTRODE) ×2 IMPLANT
SPECIMEN JAR MEDIUM (MISCELLANEOUS) ×1 IMPLANT
SPONGE LAP 18X18 X RAY DECT (DISPOSABLE) ×2 IMPLANT
STRIP CLOSURE SKIN 1/2X4 (GAUZE/BANDAGES/DRESSINGS) ×1 IMPLANT
SUT MON AB 4-0 PC3 18 (SUTURE) ×2 IMPLANT
SUT SILK 2 0 FS (SUTURE) ×1 IMPLANT
SUT VIC AB 3-0 SH 27 (SUTURE) ×2
SUT VIC AB 3-0 SH 27X BRD (SUTURE) ×1 IMPLANT
SYR BULB 3OZ (MISCELLANEOUS) ×2 IMPLANT
SYR CONTROL 10ML LL (SYRINGE) ×2 IMPLANT
TOWEL OR 17X24 6PK STRL BLUE (TOWEL DISPOSABLE) ×2 IMPLANT
TOWEL OR 17X26 10 PK STRL BLUE (TOWEL DISPOSABLE) ×2 IMPLANT
TUBE CONNECTING 12X1/4 (SUCTIONS) ×2 IMPLANT
YANKAUER SUCT BULB TIP NO VENT (SUCTIONS) ×2 IMPLANT

## 2016-11-15 NOTE — Op Note (Signed)
Excisional Breast Biopsy  Indications: This patient presents with history of right breast mass that is occult on imaging.    Pre-operative Diagnosis: right breast mass, 7 o'clock location  Post-operative Diagnosis: right breast mass  Surgeon: [name]   Anesthesia: General LMA anesthesia and Local anesthesia 1% plain lidocaine, 0.25.% bupivacaine  ASA Class: 2  Procedure Details  The patient was seen in the Holding Room. The risks, benefits, complications, treatment options, and expected outcomes were discussed with the patient. The possibilities of reaction to medication, pulmonary aspiration, bleeding, infection, the need for additional procedures, failure to diagnose a condition, and creating a complication requiring transfusion or operation were discussed with the patient. The patient concurred with the proposed plan, giving informed consent.  The site of surgery properly noted/marked. The patient was taken to Operating Room # 3, identified, and the procedure verified as Right Breast Excisional Biopsy. A Time Out was held and the above information confirmed.  After induction of anesthesia, the right  breast and chest were prepped and draped in standard fashion. The lumpectomy was performed by creating a inferolateral circumareolar incision.  Dissection was carried down around the mass with cautery.  Orientation sutures were placed on the mass.  Hemostasis was achieved with cautery.  The wound was irrigated and closed with a 3-0 Vicryl interrupted deep dermal stitch and a 4-0 Monocryl subcuticular closure in layers.    Sterile dressings were applied. At the end of the operation, all sponge, instrument, and needle counts were correct.  Findings: grossly clear surgical margins  Estimated Blood Loss:  Minimal      Specimens: right breast mass to pathology         Complications:  None; patient tolerated the procedure well.         Disposition: PACU - hemodynamically stable.          Condition: stable

## 2016-11-15 NOTE — Anesthesia Preprocedure Evaluation (Addendum)
Anesthesia Evaluation  Patient identified by MRN, date of birth, ID band Patient awake    Reviewed: Allergy & Precautions, NPO status , Patient's Chart, lab work & pertinent test results  History of Anesthesia Complications Negative for: history of anesthetic complications  Airway Mallampati: II  TM Distance: >3 FB Neck ROM: Full    Dental  (+) Teeth Intact, Dental Advisory Given   Pulmonary neg pulmonary ROS,    breath sounds clear to auscultation       Cardiovascular negative cardio ROS   Rhythm:Regular     Neuro/Psych  Headaches, PSYCHIATRIC DISORDERS Depression  Neuromuscular disease    GI/Hepatic negative GI ROS, Neg liver ROS,   Endo/Other  negative endocrine ROS  Renal/GU negative Renal ROS     Musculoskeletal negative musculoskeletal ROS (+)   Abdominal   Peds  Hematology negative hematology ROS (+)   Anesthesia Other Findings   Reproductive/Obstetrics negative OB ROS                            Anesthesia Physical Anesthesia Plan  ASA: II  Anesthesia Plan: General   Post-op Pain Management:    Induction: Intravenous  Airway Management Planned: LMA  Additional Equipment: None  Intra-op Plan:   Post-operative Plan: Extubation in OR  Informed Consent: I have reviewed the patients History and Physical, chart, labs and discussed the procedure including the risks, benefits and alternatives for the proposed anesthesia with the patient or authorized representative who has indicated his/her understanding and acceptance.   Dental advisory given  Plan Discussed with: CRNA and Surgeon  Anesthesia Plan Comments:         Anesthesia Quick Evaluation

## 2016-11-15 NOTE — Discharge Instructions (Addendum)
[facility] °Office Phone Number [phone] ° °BREAST BIOPSY/ PARTIAL MASTECTOMY: POST OP INSTRUCTIONS ° °Always review your discharge instruction sheet given to you by the facility where your surgery was performed. ° °IF YOU HAVE DISABILITY OR FAMILY LEAVE FORMS, YOU MUST BRING THEM TO THE OFFICE FOR PROCESSING.  DO NOT GIVE THEM TO YOUR DOCTOR. ° °1. A prescription for pain medication may be given to you upon discharge.  Take your pain medication as prescribed, if needed.  If narcotic pain medicine is not needed, then you may take acetaminophen (Tylenol) or ibuprofen (Advil) as needed. °2. Take your usually prescribed medications unless otherwise directed °3. If you need a refill on your pain medication, please contact your pharmacy.  They will contact our office to request authorization.  Prescriptions will not be filled after 5pm or on week-ends. °4. You should eat very light the first 24 hours after surgery, such as soup, crackers, pudding, etc.  Resume your normal diet the day after surgery. °5. Most patients will experience some swelling and bruising in the breast.  Ice packs and a good support bra will help.  Swelling and bruising can take several days to resolve.  °6. It is common to experience some constipation if taking pain medication after surgery.  Increasing fluid intake and taking a stool softener will usually help or prevent this problem from occurring.  A mild laxative (Milk of Magnesia or Miralax) should be taken according to package directions if there are no bowel movements after 48 hours. °7. Unless discharge instructions indicate otherwise, you may remove your bandages 48 hours after surgery, and you may shower at that time.  You may have steri-strips (small skin tapes) in place directly over the incision.  These strips should be left on the skin for 7-10 days.   Any sutures or staples will be removed at the office during your follow-up visit. °8. ACTIVITIES:  You may resume  regular daily activities (gradually increasing) beginning the next day.  Wearing a good support bra or sports bra (or the breast binder) minimizes pain and swelling.  You may have sexual intercourse when it is comfortable. °a. You may drive when you no longer are taking prescription pain medication, you can comfortably wear a seatbelt, and you can safely maneuver your car and apply brakes. °b. RETURN TO WORK:  __________1 week_______________ °9. You should see your doctor in the office for a follow-up appointment approximately two weeks after your surgery.  Your doctor’s nurse will typically make your follow-up appointment when she calls you with your pathology report.  Expect your pathology report 2-3 business days after your surgery.  You may call to check if you do not hear from us after three days. ° ° °WHEN TO CALL YOUR DOCTOR: °1. Fever over 101.0 °2. Nausea and/or vomiting. °3. Extreme swelling or bruising. °4. Continued bleeding from incision. °5. Increased pain, redness, or drainage from the incision. ° °The clinic staff is available to answer your questions during regular business hours.  Please don’t hesitate to call and ask to speak to one of the nurses for clinical concerns.  If you have a medical emergency, go to the nearest emergency room or call 911.  A surgeon from [facility] is always on call at the hospital. ° °For further questions, please visit [email or web address]  ° °

## 2016-11-15 NOTE — Anesthesia Postprocedure Evaluation (Addendum)
Anesthesia Post Note  Patient: Kimberly Maldonado  Procedure(s) Performed: Procedure(s) (LRB): RIGHT BREAST EXCISIONAL BIOPSY (Right)  Patient location during evaluation: PACU Anesthesia Type: General Level of consciousness: awake and alert Pain management: pain level controlled Vital Signs Assessment: post-procedure vital signs reviewed and stable Respiratory status: spontaneous breathing, nonlabored ventilation, respiratory function stable and patient connected to nasal cannula oxygen Cardiovascular status: blood pressure returned to baseline and stable Postop Assessment: no signs of nausea or vomiting Anesthetic complications: no       Last Vitals:  Vitals:   11/15/16 1250 11/15/16 1258  BP: 128/84 (!) 128/94  Pulse: 64 68  Resp: 12   Temp: 36.8 C     Last Pain:  Vitals:   11/15/16 1315  TempSrc:   PainSc: 3                  Jameshia Hayashida

## 2016-11-15 NOTE — Interval H&P Note (Signed)
History and Physical Interval Note:  11/15/2016 10:13 AM  Kimberly Maldonado  has presented today for surgery, with the diagnosis of RIGHT BREAST MASS  The various methods of treatment have been discussed with the patient and family. After consideration of risks, benefits and other options for treatment, the patient has consented to  Procedure(s): RIGHT BREAST EXCISIONAL BIOPSY (Right) as a surgical intervention .  The patient's history has been reviewed, patient examined, no change in status, stable for surgery.  I have reviewed the patient's chart and labs.  Questions were answered to the patient's satisfaction.     [name]

## 2016-11-15 NOTE — Transfer of Care (Signed)
Immediate Anesthesia Transfer of Care Note  Patient: Kimberly Maldonado  Procedure(s) Performed: Procedure(s): RIGHT BREAST EXCISIONAL BIOPSY (Right)  Patient Location: PACU  Anesthesia Type:General  Level of Consciousness: awake, alert  and oriented  Airway & Oxygen Therapy: Patient Spontanous Breathing and Patient connected to nasal cannula oxygen  Post-op Assessment: Report given to RN, Post -op Vital signs reviewed and stable and Patient moving all extremities X 4  Post vital signs: Reviewed and stable  Last Vitals:  Vitals:   11/15/16 0914  BP: 138/76  Pulse: 77  Resp: 18  Temp: 36.8 C    Last Pain:  Vitals:   11/15/16 0914  TempSrc: Oral         Complications: No apparent anesthesia complications

## 2016-11-15 NOTE — Anesthesia Procedure Notes (Signed)
Procedure Name: LMA Insertion Date/Time: 11/15/2016 10:54 AM Performed by: Luciana Axe K Pre-anesthesia Checklist: Patient identified, Emergency Drugs available, Suction available and Patient being monitored Patient Re-evaluated:Patient Re-evaluated prior to inductionOxygen Delivery Method: Circle System Utilized Preoxygenation: Pre-oxygenation with 100% oxygen Intubation Type: IV induction Ventilation: Mask ventilation without difficulty LMA: LMA inserted LMA Size: 4.0 Number of attempts: 1 Airway Equipment and Method: Bite block Placement Confirmation: positive ETCO2 and breath sounds checked- equal and bilateral Tube secured with: Tape Dental Injury: Teeth and Oropharynx as per pre-operative assessment

## 2016-11-16 ENCOUNTER — Encounter (HOSPITAL_COMMUNITY): Payer: Self-pay | Admitting: General Surgery

## 2016-11-17 ENCOUNTER — Telehealth: Payer: Self-pay | Admitting: General Surgery

## 2016-11-17 NOTE — Telephone Encounter (Signed)
Discussed unexpected pathology with patient.  Invasive ductal carcinoma.  Imaging was negative, so will order MRI.  She will need referral to genetics, med onc, rad onc.  Placing those orders today.  I will see her Monday afternoon to further discuss.

## 2016-11-20 ENCOUNTER — Other Ambulatory Visit: Payer: Self-pay | Admitting: General Surgery

## 2016-11-20 ENCOUNTER — Encounter: Payer: Self-pay | Admitting: Radiation Oncology

## 2016-11-20 DIAGNOSIS — C50012 Malignant neoplasm of nipple and areola, left female breast: Secondary | ICD-10-CM

## 2016-11-20 DIAGNOSIS — C50919 Malignant neoplasm of unspecified site of unspecified female breast: Secondary | ICD-10-CM

## 2016-11-20 DIAGNOSIS — C50011 Malignant neoplasm of nipple and areola, right female breast: Secondary | ICD-10-CM

## 2016-11-20 DIAGNOSIS — C50511 Malignant neoplasm of lower-outer quadrant of right female breast: Secondary | ICD-10-CM

## 2016-11-20 NOTE — Progress Notes (Addendum)
Location of Breast Cancer::Right Breast mass 7 o'clock position   Histology per Pathology Report: 11/15/16 Right breast excision  Receptor Status: ER(), PR (), Her2-neu (), Ki-()  Did patient present with symptoms (if so, please note symptoms) or was this found on screening mammography?:  Pain on and off several months, last mammogram  Nothing showed up, has had abnormal area in breast  Since last summer   Past/Anticipated interventions by surgeon, if [facility] 5/ 09/ 2018: Dr. 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 - INVASIVE CARCINOMA IS BROADLY PRESENT AT THE POSTERIOR, SUPERIOR, AND INFERIOR MARGINS. ALSO, LESS THAN 0.1 CM OF THE ANTERIOR MARGIN BROADLY. - IN SITU CARCINOMA IS FOCALLY <0.1 CM OF THE POSTERIOR MARGIN AND 0.1-0.2 CM OF THE ANTERIOR MARGIN.  Referral to Rad/Onc and Med Onc ordered MRI  Done 5/16+/18, results pending  Past/Anticipated interventions by medical oncology, if any: Chemotherapy :New appt 11/23/16 with Dr. Heath Lark @ 4:15pm  Lymphedema issues, if any:  No  Pain issues, if any:  NO  SAFETY ISSUES:  Prior radiation? NO  Pacemaker/ICD? NO  Possible current pregnancy? No, has Starke ring in place Is the patient on methotrexate? No Current Complaints / other details: Menarche age 47,G2P1, , depression/anxiety,  Former cigarette smoker, recently quit alcohol, no drug use Maternal Aunt Breast Cancer, surgery, living age dx early 76's now age 73,, Maternal grandfather prostate and bone cancer, Maternal great grandmother thyroid cancer  Allergies:NKA .BP 140/70 (BP Location: Left Arm, Patient Position: Sitting, Cuff Size: Normal)   Pulse 72   Temp 99.2 F (37.3 C) (Oral)   Resp 18   Ht _0  (1.753 m)   Wt 164 lb 12.8 oz (74.8 kg)   LMP 11/20/2016   BMI 24.34 kg/m   Wt Readings from Last 3 Encounters:  11/23/16 164 lb 12.8  oz (74.8 kg)  11/15/16 168 lb 1.6 oz (76.2 kg)  11/08/16 168 lb 1.6 oz (76.2 kg)     Patient states she is having B/L mastectomies Rebecca Eaton, RN 11/20/2016,11:14 AM

## 2016-11-21 ENCOUNTER — Telehealth: Payer: Self-pay | Admitting: *Deleted

## 2016-11-22 ENCOUNTER — Other Ambulatory Visit: Payer: Self-pay | Admitting: Oncology

## 2016-11-22 ENCOUNTER — Ambulatory Visit
Admission: RE | Admit: 2016-11-22 | Discharge: 2016-11-22 | Disposition: A | Discharge status: Home / Self Care | Payer: 59 | Source: Ambulatory Visit | Attending: General Surgery | Admitting: General Surgery

## 2016-11-22 DIAGNOSIS — N6313 Unspecified lump in the right breast, lower outer quadrant: Secondary | ICD-10-CM | POA: Diagnosis not present

## 2016-11-22 DIAGNOSIS — C50919 Malignant neoplasm of unspecified site of unspecified female breast: Secondary | ICD-10-CM

## 2016-11-22 MED ORDER — GADOBENATE DIMEGLUMINE 529 MG/ML IV SOLN
15.0000 mL | Freq: Once | INTRAVENOUS | Status: AC | PRN
  Administered 2016-11-22: 15 mL via INTRAVENOUS

## 2016-11-22 NOTE — Telephone Encounter (Signed)
  Oncology Nurse Navigator Documentation  Navigator Location: [facility] (11/21/16 1300) Referral date to RadOnc/MedOnc: 11/23/16 (11/21/16 1300) )Navigator Encounter Type: Introductory phone call (11/21/16 1300)   Abnormal Finding Date: 11/15/16 (11/21/16 1300) Confirmed Diagnosis Date: 11/15/16 (11/21/16 1300)   Genetic Counseling Date: 11/23/16 (11/21/16 1300) Genetic Counseling Type: Urgent (11/21/16 1300)             Barriers/Navigation Needs: Coordination of Care (11/21/16 1300)   Interventions: Coordination of Care;Education (11/21/16 1300)            Acuity: Level 2 (11/21/16 1300)         Time Spent with Patient: 30 (11/21/16 1300)

## 2016-11-23 ENCOUNTER — Encounter: Payer: Self-pay | Admitting: Radiation Oncology

## 2016-11-23 ENCOUNTER — Other Ambulatory Visit (HOSPITAL_BASED_OUTPATIENT_CLINIC_OR_DEPARTMENT_OTHER): Payer: 59

## 2016-11-23 ENCOUNTER — Ambulatory Visit
Admission: RE | Admit: 2016-11-23 | Discharge: 2016-11-23 | Disposition: A | Discharge status: Home / Self Care | Payer: 59 | Source: Ambulatory Visit | Attending: Radiation Oncology | Admitting: Radiation Oncology

## 2016-11-23 ENCOUNTER — Ambulatory Visit (HOSPITAL_BASED_OUTPATIENT_CLINIC_OR_DEPARTMENT_OTHER): Payer: 59 | Admitting: Genetics

## 2016-11-23 ENCOUNTER — Ambulatory Visit (HOSPITAL_BASED_OUTPATIENT_CLINIC_OR_DEPARTMENT_OTHER): Payer: 59 | Admitting: Oncology

## 2016-11-23 ENCOUNTER — Other Ambulatory Visit: Payer: Self-pay | Admitting: *Deleted

## 2016-11-23 ENCOUNTER — Encounter: Payer: Self-pay | Admitting: Oncology

## 2016-11-23 VITALS — BP 140/70 | HR 72 | Temp 99.2°F | Resp 18 | Ht 69.0 in | Wt 164.8 lb

## 2016-11-23 DIAGNOSIS — Z79899 Other long term (current) drug therapy: Secondary | ICD-10-CM | POA: Insufficient documentation

## 2016-11-23 DIAGNOSIS — F329 Major depressive disorder, single episode, unspecified: Secondary | ICD-10-CM | POA: Diagnosis not present

## 2016-11-23 DIAGNOSIS — G4700 Insomnia, unspecified: Secondary | ICD-10-CM | POA: Insufficient documentation

## 2016-11-23 DIAGNOSIS — C50511 Malignant neoplasm of lower-outer quadrant of right female breast: Secondary | ICD-10-CM | POA: Diagnosis not present

## 2016-11-23 DIAGNOSIS — Z1501 Genetic susceptibility to malignant neoplasm of breast: Secondary | ICD-10-CM

## 2016-11-23 DIAGNOSIS — Z809 Family history of malignant neoplasm, unspecified: Secondary | ICD-10-CM

## 2016-11-23 DIAGNOSIS — Z825 Family history of asthma and other chronic lower respiratory diseases: Secondary | ICD-10-CM | POA: Diagnosis not present

## 2016-11-23 DIAGNOSIS — Z808 Family history of malignant neoplasm of other organs or systems: Secondary | ICD-10-CM

## 2016-11-23 DIAGNOSIS — C50411 Malignant neoplasm of upper-outer quadrant of right female breast: Secondary | ICD-10-CM

## 2016-11-23 DIAGNOSIS — Z8042 Family history of malignant neoplasm of prostate: Secondary | ICD-10-CM

## 2016-11-23 DIAGNOSIS — Z170 Estrogen receptor positive status [ER+]: Secondary | ICD-10-CM

## 2016-11-23 DIAGNOSIS — Z803 Family history of malignant neoplasm of breast: Secondary | ICD-10-CM | POA: Insufficient documentation

## 2016-11-23 DIAGNOSIS — Z9889 Other specified postprocedural states: Secondary | ICD-10-CM | POA: Insufficient documentation

## 2016-11-23 DIAGNOSIS — G5600 Carpal tunnel syndrome, unspecified upper limb: Secondary | ICD-10-CM | POA: Insufficient documentation

## 2016-11-23 DIAGNOSIS — F419 Anxiety disorder, unspecified: Secondary | ICD-10-CM | POA: Insufficient documentation

## 2016-11-23 DIAGNOSIS — Z315 Encounter for genetic counseling: Secondary | ICD-10-CM

## 2016-11-23 DIAGNOSIS — Z8249 Family history of ischemic heart disease and other diseases of the circulatory system: Secondary | ICD-10-CM | POA: Diagnosis not present

## 2016-11-23 DIAGNOSIS — C50911 Malignant neoplasm of unspecified site of right female breast: Secondary | ICD-10-CM | POA: Diagnosis present

## 2016-11-23 DIAGNOSIS — C50919 Malignant neoplasm of unspecified site of unspecified female breast: Secondary | ICD-10-CM | POA: Diagnosis not present

## 2016-11-23 HISTORY — DX: Anxiety disorder, unspecified: F41.9

## 2016-11-23 HISTORY — DX: Malignant neoplasm of unspecified site of unspecified female breast: C50.919

## 2016-11-23 LAB — COMPREHENSIVE METABOLIC PANEL
ALT: 22 U/L (ref 0–55)
AST: 17 U/L (ref 5–34)
Albumin: 3.9 g/dL (ref 3.5–5.0)
Alkaline Phosphatase: 41 U/L (ref 40–150)
Anion Gap: 7 mEq/L (ref 3–11)
BUN: 12.8 mg/dL (ref 7.0–26.0)
CO2: 26 mEq/L (ref 22–29)
Calcium: 9.3 mg/dL (ref 8.4–10.4)
Chloride: 107 mEq/L (ref 98–109)
Creatinine: 0.9 mg/dL (ref 0.6–1.1)
EGFR: >90 mL/min/{1.73_m2} (ref >90)
Glucose: 85 mg/dl (ref 70–140)
Potassium: 4 mEq/L (ref 3.5–5.1)
Sodium: 139 mEq/L (ref 136–145)
Total Bilirubin: 0.31 mg/dL (ref 0.20–1.20)
Total Protein: 7.2 g/dL (ref 6.4–8.3)

## 2016-11-23 LAB — CBC WITH DIFFERENTIAL/PLATELET
BASO%: 0.6 % (ref 0.0–2.0)
Basophils Absolute: 0 10*3/uL (ref 0.0–0.1)
EOS%: 1.8 % (ref 0.0–7.0)
Eosinophils Absolute: 0.1 10*3/uL (ref 0.0–0.5)
HCT: 39 % (ref 34.8–46.6)
HGB: 12.9 g/dL (ref 11.6–15.9)
LYMPH%: 38.4 % (ref 14.0–49.7)
MCH: 30.2 pg (ref 25.1–34.0)
MCHC: 33 g/dL (ref 31.5–36.0)
MCV: 91.7 fL (ref 79.5–101.0)
MONO#: 0.7 10*3/uL (ref 0.1–0.9)
MONO%: 12.8 % (ref 0.0–14.0)
NEUT#: 2.4 10*3/uL (ref 1.5–6.5)
NEUT%: 46.4 % (ref 38.4–76.8)
Platelets: 219 10*3/uL (ref 145–400)
RBC: 4.25 10*6/uL (ref 3.70–5.45)
RDW: 14 % (ref 11.2–14.5)
WBC: 5.1 10*3/uL (ref 3.9–10.3)
lymph#: 2 10*3/uL (ref 0.9–3.3)

## 2016-11-23 MED ORDER — TAMOXIFEN CITRATE 20 MG PO TABS: 20.0000 mg | ORAL_TABLET | Freq: Every day | ORAL | 12 refills | Status: AC

## 2016-11-23 NOTE — Progress Notes (Signed)
[place]  Telephone:([phone]) [phone] Fax:(336) [phone]     ID: Kimberly Maldonado DOB: 24-Apr-1987  MR#: [id number]  GUY#:[id number]  Patient Care Team: Aretta Nip, MD as PCP - General (Family Medicine) Kendre Jacinto, Virgie Dad, MD as Consulting Physician (Oncology) Kyung Rudd, MD as Consulting Physician (Radiation Oncology) Mal Misty as Counselor (Genetics) [name], MD as Consulting Physician (General Surgery) Earnstine Regal, PA-C as Physician Assistant (Obstetrics and Gynecology) Chauncey Cruel, MD OTHER MD:  CHIEF COMPLAINT: Estrogen receptor positive breast cancer  CURRENT TREATMENT: Neoadjuvant tamoxifen   BREAST CANCER HISTORY: [place] bilateral diagnostic mammography with tomography at the [facility] 02/08/2016, finding the breast density to be category D. In the lateral right breast there was a 1.3 cm area of calcifications which appeared amorphous. Spot compression tomosynthesis of both axillae and ultrasound of both axillae showed multiple normal-appearing lymph nodes but no suspicious masses. Six-month follow-up was suggested.  On 08/21/2016 right diagnostic mammography with tomography the 1.3 cm area of calcifications in the right breast upper outer quadrant was unchanged and was felt to be probably benign. Six-month follow-up was suggested.  However, when the patient saw Dr. Florene Glen for routine gynecologic examination Dr. Florene Glen noted the mass and was sufficiently concerned to refer Kimberly Maldonado to surgery. Dr. Cher Nakai agreed the mass should be more urgently evaluated and on 11/15/2016 [facility] underwent excisional biopsy of the area in question. 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 The posterior ,superior and inferior margins were all broadly positive. In situ carcinoma also was close to the posterior margin. The invasive tumor was estrogen receptor positive at  95%, progesterone receptor positive at 95%, both with strong staining intensity, with an MIB-1 of 20%, and no HER-2 amplification, the signals ratio being 1.52 and the number per cell 2.35.  The patient's subsequent history is as detailed below.   INTERVAL HISTORY: Kimberly Maldonado was evaluated in the breast clinic 11/23/2016 accompanied by her friend Kimberly Maldonado, who is a Designer, jewellery in the critical care floor at [facility] case was also presented in the multidisciplinary breast cancer conference 11/22/2016. At that time a preliminary plan was proposed: Breast MRI given the breast density and the fact that the mammography was not revealing; consideration of bilateral mastectomies with right sentinel lymph node sampling (patient's choice); likely no radiation if the patient does have bilateral mastectomies and lymph nodes are negative; hormone therapy to follow. The patient will also need genetics testing.  REVIEW OF SYSTEMS: Aside from the mass itself, there  were no specific symptoms leading to the original mammogram, which was routinely scheduled. The patient denies unusual headaches, visual changes, nausea, vomiting, stiff neck, dizziness, or gait imbalance. There has been no cough, phlegm production, or pleurisy, no chest pain or pressure, and no change in bowel or bladder habits. The patient denies fever, rash, bleeding, unexplained fatigue or unexplained weight loss. She exercises by walking and using a treadmill but has fallen off the walker lately she says. A detailed review of systems was otherwise entirely negative.     PAST MEDICAL HISTORY: Past Medical History:  Diagnosis Date  . Allergy    takes Zyrtec and Singulair daily  . Anxiety   . Breast cancer ([place]) 11/15/2016   right breast  . Carpal tunnel syndrome   . Depression    takes Cymbalta daily  . Insomnia    takes Trazodone and Ambien nightly  . Migraines  PAST SURGICAL HISTORY: Past Surgical History:    Procedure Laterality Date  . BREAST BIOPSY Right 11/15/2016   Procedure: RIGHT BREAST EXCISIONAL BIOPSY;  Surgeon: [name], MD;  Location: [facility];  Service: General;  Laterality: Right;  . COLONOSCOPY    . KIDNEY DONATION Left     FAMILY HISTORY Family History  Problem Relation Age of Onset  . COPD Maternal Grandmother   . Hypertension Maternal Grandfather   . Cancer Maternal Grandfather        prostate and bone cancer  . Diabetes Mother   . Breast cancer Maternal Aunt   . Cancer Maternal Aunt        breast  . Cancer Other        thyroid  The patient's father is 57 years old and the patient's mother 104 years old as of May 2018. The patient has 7 brothers and 2 sisters but they are all half siblings. She is the only child her father and mother have had together   GYNECOLOGIC HISTORY:  Patient's last menstrual period was 11/20/2016. Menarche age 41, first live birth age 41, she is [place] P1. She has regular periods, lasting usually about 5 days of which 2 are heavy. 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 She donated a kidney to her first husband who died from sickle cell disease complications. She is divorced from her second husband. At home is just she and her daughter Kimberly Maldonado, 50 years old as of May 2018, as well as a [place] poodle mix. The patient attends the day*church    ADVANCED DIRECTIVES: [place] has named her friend Kimberly Maldonado as her healthcare part of attorney. Kimberly Maldonado can be reached at [phone]   HEALTH MAINTENANCE: Social History  Substance Use Topics  . Smoking status: Never Smoker  . Smokeless tobacco: Never Used     Comment: quit smoking 21 yrs ago  . Alcohol use Yes     Colonoscopy: n/a  PAP:  Bone density:   No Known Allergies  Current Outpatient Prescriptions  Medication Sig Dispense Refill  . cetirizine (ZYRTEC) 10 MG tablet Take 10 mg by mouth daily as needed for  allergies.    . DULoxetine (CYMBALTA) 60 MG capsule Take 60 mg by mouth daily.    Marland Kitchen ibuprofen (ADVIL,MOTRIN) 600 MG tablet Take 1 tablet (600 mg total) by mouth every 6 (six) hours as needed. 40 tablet 1  . montelukast (SINGULAIR) 10 MG tablet Take 10 mg by mouth daily as needed.    . Multiple Vitamins-Minerals (HAIR/SKIN/NAILS/BIOTIN PO) Take 2 each by mouth daily.    Marland Kitchen oxyCODONE (OXY IR/ROXICODONE) 5 MG immediate release tablet Take 1-2 tablets (5-10 mg total) by mouth every 6 (six) hours as needed for moderate pain, severe pain or breakthrough pain. 15 tablet 0  . pseudoephedrine-guaifenesin (MUCINEX D) 60-600 MG 12 hr tablet Take 1 tablet by mouth 2 (two) times daily as needed for congestion.    . traZODone (DESYREL) 50 MG tablet Take 50 mg by mouth at bedtime.    Marland Kitchen zolpidem (AMBIEN) 10 MG tablet Take 5 mg by mouth at bedtime.     Marland Kitchen etonogestrel-ethinyl estradiol (NUVARING) 0.12-0.015 MG/24HR vaginal ring Insert vaginally and leave in place for 3 consecutive weeks, then remove for 1 week. 1 each 11   No current facility-administered medications for this visit.     OBJECTIVE:Young African-American woman who appears well   Vitals:  11/23/16 1552  BP: 140/70  Pulse: 72  Resp: 19  Temp: 99.2 F (37.3 C)     Body mass index is 24.53 kg/m.    ECOG FS:0 - Asymptomatic  Ocular: Sclerae unicteric, pupils equal, round and reactive to light Ear-nose-throat: Oropharynx clear and moist Lymphatic: No cervical or supraclavicular adenopathy Lungs no rales or rhonchi, good excursion bilaterally Heart regular rate and rhythm, no murmur appreciated Abd soft, nontender, positive bowel sounds MSK no focal spinal tenderness, no joint edema Neuro: non-focal, well-oriented, appropriate affect Breasts: The right breast is status post excisional biopsy. The incision is healing nicely. I do not palpate a mass at present. The left breast is unremarkable. Both axillae are benign.   LAB RESULTS:  CMP      Component Value Date/Time   NA 139 11/23/2016 1530   K 4.0 11/23/2016 1530   CL 108 11/08/2016 0923   CO2 26 11/23/2016 1530   GLUCOSE 85 11/23/2016 1530   BUN 12.8 11/23/2016 1530   CREATININE 0.9 11/23/2016 1530   CALCIUM 9.3 11/23/2016 1530   PROT 7.2 11/23/2016 1530   ALBUMIN 3.9 11/23/2016 1530   AST 17 11/23/2016 1530   ALT 22 11/23/2016 1530   ALKPHOS 41 11/23/2016 1530   BILITOT 0.31 11/23/2016 1530   GFRNONAA >60 11/08/2016 0923   GFRAA >60 11/08/2016 0923    No results found for: TOTALPROTELP, ALBUMINELP, A1GS, A2GS, BETS, BETA2SER, GAMS, MSPIKE, SPEI  No results found for: Nils Pyle, [facility]  Lab Results  Component Value Date   WBC 5.1 11/23/2016   NEUTROABS 2.4 11/23/2016   HGB 12.9 11/23/2016   HCT 39.0 11/23/2016   MCV 91.7 11/23/2016   PLT 219 11/23/2016      Chemistry      Component Value Date/Time   NA 139 11/23/2016 1530   K 4.0 11/23/2016 1530   CL 108 11/08/2016 0923   CO2 26 11/23/2016 1530   BUN 12.8 11/23/2016 1530   CREATININE 0.9 11/23/2016 1530      Component Value Date/Time   CALCIUM 9.3 11/23/2016 1530   ALKPHOS 41 11/23/2016 1530   AST 17 11/23/2016 1530   ALT 22 11/23/2016 1530   BILITOT 0.31 11/23/2016 1530       No results found for: LABCA2  No components found for: [id number]  No results for input(s): INR in the last 168 hours.  Urinalysis    Component Value Date/Time   PHURINE 4.5 02/20/2012 1620     STUDIES: Mr Breast Bilateral W Wo Contrast  Result Date: 11/23/2016 CLINICAL DATA:  New diagnosis of right breast invasive ductal carcinoma by excisional biopsy of a palpable mass at 7 o'clock. LABS:  None. EXAM: BILATERAL BREAST MRI WITH AND WITHOUT CONTRAST TECHNIQUE: Multiplanar, multisequence MR images of both breasts were obtained prior to and following the intravenous administration of 15 ml of MultiHance. THREE-DIMENSIONAL MR IMAGE RENDERING ON INDEPENDENT WORKSTATION: Three-dimensional MR  images were rendered by post-processing of the original MR data on an independent workstation. The three-dimensional MR images were interpreted, and findings are reported in the following complete MRI report for this study. Three dimensional images were evaluated at the independent DynaCad workstation COMPARISON:  Previous exam(s). FINDINGS: Breast composition: c. Heterogeneous fibroglandular tissue. Background parenchymal enhancement: Marked. Right breast: There is a seroma cavity in the right breast lower outer quadrant. Heterogeneous linear and nodular enhancement is seen along the borders of the seroma cavity, most prominent in its posterolateral aspect. An area of faint progressive non  mass enhancement is seen in the right 11 o'clock breast, 4 cm posterior to the nipple. The area measures approximately 1.4 x 0.8 cm, image 72/144, sequence 5. There is a 5 mm focus of heterogeneous enhancement in the right breast 11 o'clock, 2.3 cm posterior to the nipple, image 75/114, sequence 9. Additionally, there are 2 progressive foci of enhancement in the right 12 o'clock breast, posterior depth, which measure 4 mm each, image 77/144, sequence 5. Left breast: There is a circumscribed progressively enhancing mass in the left breast slightly lower outer quadrant, at 3:30 o'clock, 8 cm posterior to the nipple. The mass measures 1.3 x 1.1 x 1.3 cm. Lymph nodes: There are 2 adjacent abnormal right axillary lymph nodes, which demonstrate cortical thickening and fatty hilar effacement. Ancillary findings:  Small right pleural effusion. IMPRESSION: Right breast lower outer quadrant postsurgical seroma with linear nodular enhancement along its borders, likely corresponding to the areas of tumor involvement, most prominent posterolaterally. Area of week progressive non mass enhancement in the right 11 o'clock breast, middle depth, a focus of enhancement in the right breast 11 o'clock, anterior depth, and 2 foci of enhancement in the  right 12 o'clock breast, middle to posterior depth. These findings are slightly more prominent than the background of dense breast parenchyma and marked parenchymal enhancement, and therefore their significance is indeterminate. Given the new diagnosis of invasive right breast carcinoma, MRI guided core needle biopsy of the area of non mass enhancement, the focus of enhancement at 11 o'clock, and the more posterior of the 2 foci of enhancement at 12 o'clock, may be considered. Two abnormal right axillary lymph nodes, for which second-look ultrasound to plan for a core needle biopsy is recommended. Moderately suspicious 1.3 cm enhancing mass in the left 330 o'clock breast, for which second-look ultrasound to plan for ultrasound-guided core needle biopsy is recommended. If this mass is not found by ultrasound, then MRI guided core needle biopsy should be considered. RECOMMENDATION: Second-look ultrasound of the right axilla for suspected malignant right axillary lymphadenopathy. Second-look ultrasound of the left breast for moderately suspicious mass at 3:30 o'clock. MRI guided core needle biopsy of right breast 11 and 12 o'clock area of indeterminate non mass enhancement and foci of enhancement. BI-RADS CATEGORY  6: Known biopsy-proven malignancy. Electronically Signed   By: Fidela Salisbury M.D.   On: 11/23/2016 12:15    ELIGIBLE FOR AVAILABLE RESEARCH PROTOCOL: no  ASSESSMENT: 41 y.o. [place] woman status post right breast upper outer quadrant excisional biopsy 11/15/2016 for a pT2 cN0, stage IIA invasive ductal carcinoma, grade 3, estrogen and progesterone receptor positive, HER-2 nonamplified, with an MIB-1 of 20%. Multiple margins were broadly positive  (a) breast MRI of 11/23/2016 shows additional areas of concern in both breasts and 2 suspicious right axillary lymph nodes  (1) genetics testing scheduled for 11/23/2016  (2) neoadjuvant tamoxifen started 11/23/2016 pending definitive  surgery  (3) consider bilateral mastectomies, with right sentinel lymph node sampling  (4) Oncotype or Mammaprint to assess need for chemotherapy.  (5) adjuvant radiation as appropriate  (6) anti-estrogens to continue for a total of 10 years.    PLAN: We spent the better part of today's hour-long appointment discussing the biology of breast cancer in general, and the specifics of the patient's tumor in particular. We first reviewed the fact that cancer is not one disease but more than 100 different diseases and that it is important to keep them separate-- otherwise when friends and relatives discuss their own cancer experiences  with Junella confusion can result. Similarly we explained that if breast cancer spreads to the bone or liver, the patient would not have bone cancer or liver cancer, but breast cancer in the bone and breast cancer in the liver: one cancer in three places-- not 3 different cancers which otherwise would have to be treated in 3 different ways.  We discussed the difference between local and systemic therapy. In terms of loco-regional treatment, lumpectomy plus radiation is equivalent to mastectomy as far as survival is concerned. For this reason we generally recommend breast conserving surgery. However in this case we have to consider the fact that the patient's breast density is D, and that 2 sequential mammograms focusing on the area of concern failed to show any abnormality. The patient is very motivated to bilateral mastectomies for this reason, because of concerns regarding symmetry, it and because of fear of further cancers developing in the future which will be equally difficult to detect. I would support that decision on that basis.  We also noted that in terms of sequencing of treatments, whether systemic therapy or surgery is done first does not affect the ultimate outcome. This is relevant to [place]'s situation since we will recommend she start tamoxifen now so she has  enough time to plan her definitive surgery without hurry.  We then discussed the rationale for systemic therapy. There is some risk that this cancer may have already spread to other parts of her body. Patients frequently ask at this point about bone scans, CAT scans and PET scans to find out if they have occult breast cancer somewhere else. The problem is that in early stage disease we are much more likely to find false positives then true cancers and this would expose the patient to unnecessary procedures as well as unnecessary radiation. Scans cannot answer the question the patient really would like to know, which is whether she has microscopic disease elsewhere in her body. For those reasons we do not recommend them.  Of course we would proceed to aggressive evaluation of any symptoms that might suggest metastatic disease, but that is not the case here.  Next we went over the options for systemic therapy which are anti-estrogens, anti-HER-2 immunotherapy, and chemotherapy. Christena does not meet criteria for anti-HER-2 immunotherapy. She is a good candidate for anti-estrogens.  The question of chemotherapy is more complicated. Chemotherapy is most effective in rapidly growing, aggressive tumors. It may not be as effective in mixed tumors like Araiya 's (high-grade, not fast growing). For that reason we are going to request a Mammaprint from the definitive surgical sample, as suggested by NCCN guidelines. That will help [place] make a definitive decision regarding chemotherapy in this case.  Finally, since [place] needs to meet with the plastic surgeon, decide which type of reconstruction she will have (discussed in a primary fashion today), and then settle on a surgical date, I thought it would be prudent for her to start tamoxifen now, since she will be on it later on. We discussed the possible toxicities, side effects and complications of this agent and I have gone ahead and placed a prescription for  her.  It would be a good idea to switch from the nuvaring to a Mirena IUD and I will request that of her gynecologist Dr. Estill Bakes has a good understanding of the overall plan. She agrees with it. She knows the goal of treatment in her case is cure. She will call with any problems that may develop before  her next visit here.  Chauncey Cruel, MD   11/23/2016 5:13 PM Medical Oncology and Hematology [facility] [address] [place], [place] [address] Tel. [phone]    Fax. [phone]

## 2016-11-23 NOTE — Progress Notes (Signed)
Please see the Nurse Progress Note in the MD Initial Consult Encounter for this patient. 

## 2016-11-23 NOTE — Progress Notes (Signed)
Radiation Oncology         [phone] ________________________________  Name: Kimberly Maldonado MRN: [id number]  Date: 11/23/2016  DOB: [birth year]  UU:VOZDGUY, Bill Salinas, MD  Magrinat, Virgie Dad, MD     REFERRING PHYSICIAN: Magrinat, Virgie Dad, MD   DIAGNOSIS: The primary encounter diagnosis was Malignant neoplasm of lower-outer quadrant of right female breast, unspecified estrogen receptor status ([facility]). A diagnosis of Primary cancer of upper outer quadrant of right breast [facility]) was also pertinent to this visit.   HISTORY OF PRESENT ILLNESS: Kimberly Maldonado is a 41 y.o. female seen at the request of Dr. Barry Dienes for a new diagnosis of right breast cancer. The patient noted a palpable mass in the right breast and proceeded with mammogram on 08/21/16 which revealed no suspicious masses, and there was a stable area of calcifications 13 mm in size in the upper outer quadrant. She was seen by Dr. 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 The tumor was ER/PR positive, HER2 negative, with a Ki67 of 20%. She underwent an MRI on 11/22/16 is pending. She is contemplating bilateral mastectomies with reconstruction. She comes today to discuss possible options of radiotherapy.   PREVIOUS RADIATION THERAPY: No   PAST MEDICAL HISTORY:  Past Medical History:  Diagnosis Date  . Allergy    takes Zyrtec and Singulair daily  . Anxiety   . Breast cancer ([place]) 11/15/2016   right breast  . Carpal tunnel syndrome   . Depression    takes Cymbalta daily  . Insomnia    takes Trazodone and Ambien nightly  . Migraines        PAST SURGICAL HISTORY: Past Surgical History:  Procedure Laterality Date  . BREAST BIOPSY Right 11/15/2016   Procedure: RIGHT BREAST EXCISIONAL BIOPSY;  Surgeon: [name], MD;  Location: [facility];  Service: General;  Laterality: Right;  . COLONOSCOPY    . KIDNEY DONATION Left       FAMILY HISTORY:  Family History  Problem Relation Age of Onset  . COPD Maternal Grandmother   . Hypertension Maternal Grandfather   . Cancer Maternal Grandfather        prostate and bone cancer  . Diabetes Mother   . Breast cancer Maternal Aunt   . Cancer Maternal Aunt        breast  . Cancer Other        thyroid     SOCIAL HISTORY:  reports that she has never smoked. She has never used smokeless tobacco. She reports that she drinks alcohol. She reports that she does not use drugs. The patient is married and lives in [place]. 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 She has an 28 year old daughter. She's accompanied by a close friend.   ALLERGIES: Patient has no active allergies.   MEDICATIONS:  Current Outpatient Prescriptions  Medication Sig Dispense Refill  . cetirizine (ZYRTEC) 10 MG tablet Take 10 mg by mouth daily as needed for allergies.    . DULoxetine (CYMBALTA) 60 MG capsule Take 60 mg by mouth daily.    Marland Kitchen ibuprofen (ADVIL,MOTRIN) 600 MG tablet Take 1 tablet (600 mg total) by mouth every 6 (six) hours as needed. 40 tablet 1  . montelukast (SINGULAIR) 10 MG tablet Take 10 mg by mouth daily as needed.    . Multiple Vitamins-Minerals (HAIR/SKIN/NAILS/BIOTIN PO) Take 2 each by mouth daily.    Marland Kitchen  pseudoephedrine-guaifenesin (MUCINEX D) 60-600 MG 12 hr tablet Take 1 tablet by mouth 2 (two) times daily as needed for congestion.    . traZODone (DESYREL) 50 MG tablet Take 50 mg by mouth at bedtime.    Marland Kitchen zolpidem (AMBIEN) 10 MG tablet Take 5 mg by mouth at bedtime.     Marland Kitchen etonogestrel-ethinyl estradiol (NUVARING) 0.12-0.015 MG/24HR vaginal ring Insert vaginally and leave in place for 3 consecutive weeks, then remove for 1 week. 1 each 11  . oxyCODONE (OXY IR/ROXICODONE) 5 MG immediate release tablet Take 1-2 tablets (5-10 mg total) by mouth every 6 (six) hours as needed for moderate pain, severe pain or breakthrough pain. (Patient not taking: Reported on  11/23/2016) 15 tablet 0   No current facility-administered medications for this encounter.      REVIEW OF SYSTEMS: On review of systems, the patient reports that she is doing well overall. She's trying to process the diagnosis and feels like she's coping pretty well. She denies any chest pain, shortness of breath, cough, fevers, chills, night sweats, unintended weight changes. She denies any bowel or bladder disturbances, and denies abdominal pain, nausea or vomiting. She denies any new musculoskeletal or joint aches or pains. A complete review of systems is obtained and is otherwise negative.     PHYSICAL EXAM:  Wt Readings from Last 3 Encounters:  11/23/16 164 lb 12.8 oz (74.8 kg)  11/15/16 168 lb 1.6 oz (76.2 kg)  11/08/16 168 lb 1.6 oz (76.2 kg)   Temp Readings from Last 3 Encounters:  11/23/16 99.2 F (37.3 C) (Oral)  11/15/16 98.2 F (36.8 C)  11/08/16 98.1 F (36.7 C)   BP Readings from Last 3 Encounters:  11/23/16 140/70  11/15/16 (!) 128/94  11/08/16 133/81   Pulse Readings from Last 3 Encounters:  11/23/16 72  11/15/16 68  11/08/16 72   Pain Assessment Pain Score: 0-No pain/10  In general this is a well appearing African American female in no acute distress. She's alert and oriented x4 and appropriate throughout the examination. Cardiopulmonary assessment is negative for acute distress and she exhibits normal effort. Breast exam is deferred.   ECOG = 0  0 - Asymptomatic (Fully active, able to carry on all predisease activities without restriction)  1 - Symptomatic but completely ambulatory (Restricted in physically strenuous activity but ambulatory and able to carry out work of a light or sedentary nature. For example, light housework, office work)  2 - Symptomatic, <50% in bed during the day (Ambulatory and capable of all self care but unable to carry out any work activities. Up and about more than 50% of waking hours)  3 - Symptomatic, >50% in bed, but not  bedbound (Capable of only limited self-care, confined to bed or chair 50% or more of waking hours)  4 - Bedbound (Completely disabled. Cannot carry on any self-care. Totally confined to bed or chair)  5 - Death   Eustace Pen MM, Creech RH, Tormey DC, et al. [phone]). "Toxicity and response criteria of the [facility]". [place] Oncol. 5 (6): 649-55    LABORATORY DATA:  Lab Results  Component Value Date   WBC 4.4 11/08/2016   HGB 12.6 11/08/2016   HCT 38.0 11/08/2016   MCV 89.8 11/08/2016   PLT 208 11/08/2016   Lab Results  Component Value Date   NA 136 11/08/2016   K 3.7 11/08/2016   CL 108 11/08/2016   CO2 17 (L) 11/08/2016   No results found  for: ALT, AST, GGT, ALKPHOS, BILITOT    RADIOGRAPHY: No results found.     IMPRESSION/PLAN: 1. At least Stage IB, cT2NxMx grade 3, ER/PR positive invasive ductal carcinoma of the right breast. The patient met with Dr. Lisbeth Renshaw and myself today to discuss the pathology findings, and data collected to date. We discussed her case yesterday in conference, and recommendations were for her to proceed with surgical assessment. She is interested in bilateral mastectomies with reconstruction, and has undergone an MRI as well. We discussed in patients who are considering lumpectomy, radiotherapy would be a recommendation as part of her treatment. Although based on her surgical plans, we would not anticipate a role for radiation,  discuss scenarios for the role of radiotherapy in the postmastectomy setting and reviewed the risks, benefits, short, and long term effects of therapy. She appreciated the opportunity to meet and we will see her back as clinically indicated. 2. Possible genetic predisposition to malignancy. The patient will be seen in genetics today and may proceed with testing.   The above documentation reflects my direct findings during this shared patient visit. Please see the separate note by Dr. Lisbeth Renshaw on this date for the  remainder of the patient's plan of care.    Carola Rhine, PAC

## 2016-11-24 ENCOUNTER — Telehealth: Payer: Self-pay | Admitting: General Surgery

## 2016-11-24 ENCOUNTER — Telehealth: Payer: Self-pay | Admitting: *Deleted

## 2016-11-24 ENCOUNTER — Other Ambulatory Visit: Payer: Self-pay | Admitting: General Surgery

## 2016-11-24 DIAGNOSIS — R5381 Other malaise: Secondary | ICD-10-CM

## 2016-11-24 NOTE — Telephone Encounter (Signed)
Ordered mammaprint per Dr;.Magrinat. Faxed requisition to pathology.  

## 2016-11-24 NOTE — Telephone Encounter (Signed)
Discussed MRI results with Dr. Dutch Quint and with patient.  Will need right axillary LN bx and left breast bx.  Planning bilateral mastectomies, so concerning right breast lesion does not need to get biopsied.   Have placed orders for these to get done.

## 2016-11-27 ENCOUNTER — Encounter: Payer: Self-pay | Admitting: Genetics

## 2016-11-27 ENCOUNTER — Other Ambulatory Visit: Payer: Self-pay | Admitting: General Surgery

## 2016-11-27 DIAGNOSIS — N63 Unspecified lump in unspecified breast: Secondary | ICD-10-CM

## 2016-11-27 DIAGNOSIS — N6489 Other specified disorders of breast: Secondary | ICD-10-CM | POA: Diagnosis not present

## 2016-11-27 DIAGNOSIS — C50911 Malignant neoplasm of unspecified site of right female breast: Secondary | ICD-10-CM | POA: Diagnosis not present

## 2016-11-27 DIAGNOSIS — R590 Localized enlarged lymph nodes: Secondary | ICD-10-CM | POA: Diagnosis not present

## 2016-11-27 NOTE — Progress Notes (Signed)
REFERRING PROVIDER: [name], MD [address] [place], [place] [address]  PRIMARY PROVIDER:  Aretta Nip, MD  PRIMARY REASON FOR VISIT:  1. Malignant neoplasm of right breast in female, estrogen receptor positive, unspecified site of breast ([place])      HISTORY OF PRESENT ILLNESS:   Ms. Kimberly Maldonado, a 41 y.o. female, was seen for a [place] cancer genetics consultation at the request of Dr. Barry Dienes due to a personal and family history of cancer.  Kimberly Maldonado presents to clinic today to discuss the possibility of a hereditary predisposition to cancer, genetic testing, and to further clarify her future cancer risks, as well as potential cancer risks for family members.   In May 2018, at the age of 37, [name] was diagnosed with ER/PR+ HER2- invasive ductal carcinoma of the right breast. Her treatment is pending, but [name] states that she desires bilateral mastectomies, even without knowledge of her genetic testing results.   CANCER HISTORY: Kimberly Maldonado was recently diagnosed ER/PR+ HER2- invasive ductal carcinoma of her right breast at age 19.  Past Medical History:  Diagnosis Date  . Allergy    takes Zyrtec and Singulair daily  . Anxiety   . Breast cancer ([facility]) 11/15/2016   right breast  . Carpal tunnel syndrome   . Depression    takes Cymbalta daily  . Insomnia    takes Trazodone and Ambien nightly  . Migraines     Past Surgical History:  Procedure Laterality Date  . BREAST BIOPSY Right 11/15/2016   Procedure: RIGHT BREAST EXCISIONAL BIOPSY;  Surgeon: [name], MD;  Location: [facility];  Service: General;  Laterality: Right;  . COLONOSCOPY    . KIDNEY DONATION Left     Social History   Social History  . Marital status: Married    Spouse name: N/A  . Number of children: N/A  . Years of education: N/A   Social History Main Topics  . Smoking status: Never Smoker  . Smokeless tobacco: Never Used     Comment: quit smoking 21 yrs ago  . Alcohol use  Yes  . Drug use: No  . Sexual activity: Yes    Birth control/ protection: Inserts     Comment: nuva ring   Other Topics Concern  . Not on file   Social History Narrative  . No narrative on file     FAMILY HISTORY:  We obtained a detailed, 4-generation family history.  Significant diagnoses are listed below: Family History  Problem Relation Age of Onset  . COPD Maternal Grandmother   . Hypertension Maternal Grandfather   . Prostate cancer Maternal Grandfather 9  . Bone cancer Maternal Grandfather 34  . Diabetes Mother   . Breast cancer Maternal Aunt 42  . Thyroid cancer Other 40       d.64s   Kimberly Maldonado has an 34 year old daughter who has no personal history of cancers or tumors. Kimberly Maldonado states that her daughter does have sickle cell trait. Kimberly Maldonado is the only child shared between her parents.  Kimberly Maldonado has 2 maternal half-sisters, ages 52 and 14, and 2 maternal half-brothers, ages 8 and 3. None have had cancers or tumors. Kimberly Maldonado mother is 20 without cancers. Kimberly Maldonado has 4 maternal aunts, ages 52-65. The oldest maternal aunt had breast cancer in her early-40s. Kimberly Maldonado also has 3 maternal uncles who are between the ages of 28-65 and have no cancers. Kimberly Maldonado maternal grandfather  was diagnosed with prostate cancer at 3 and bone cancer at 64. He recently died at age 51. Kimberly Maldonado maternal grandmother died at 85 without cancers. This grandmother's mother died of thyroid cancer in her 43s.  Kimberly Maldonado has 5 paternal half-brothers between the ages of 75 and 49. None have histories of cancers or tumors. Kimberly Maldonado father is 36 without cancers. Kimberly Maldonado has 2 paternal aunts and 2 paternal uncles, all in their 88s or 101s, without cancers. Kimberly Maldonado paternal grandparents both died after age 108 without cancers.  Kimberly Maldonado is unaware of previous family history of genetic testing for hereditary cancer risks. Patient's maternal ancestors are of black and Native American  descent, and paternal ancestors are of black descent. There is no reported Ashkenazi Jewish ancestry. There is no known consanguinity.  GENETIC COUNSELING ASSESSMENT: Kimberly Maldonado is a 41 y.o. female with a personal and family history of young breast cancer which is somewhat suggestive of a hereditary cancer syndrome and predisposition to cancer. We, therefore, discussed and recommended the following at today's visit.   DISCUSSION: We reviewed the characteristics, features and inheritance patterns of hereditary cancer syndromes. We also discussed genetic testing, including the appropriate family members to test, the process of testing, insurance coverage and turn-around-time for results. We discussed the implications of a negative, positive and/or variant of uncertain significant result. We recommended Kimberly Maldonado pursue genetic testing for a custom panel that combines the Common Hereditary Cancers Panel + Thyroid Cancer Panel offered by [facility]. This custom panel includes analysis of the following 48 genes: APC, ATM, AXIN2, BARD1, BMPR1A, BRCA1, BRCA2, BRIP1, CDH1, CDKN2A, CHEK2, CTNNA1, DICER1, EPCAM, GREM1, HOXB13, KIT, MEN1, MLH1, MSH2, MSH3, MSH6, MUTYH, NBN, NF1, NTHL1, PALB2, PDGFRA, PMS2, POLD1, POLE, PRKAR1A, PTEN, RAD50, RAD51C, RAD51D, RET, SDHA, SDHB, SDHC, SDHD, SMAD4, SMARCA4, STK11, TP53, TSC1, TSC2, and VHL. Because Kimberly Maldonado stated that she desires bilateral mastectomies regardless of her genetic testing results, the 48-gene test in its entirety will be ordered rather than a breast STAT panel with reflex.  Based on Kimberly Maldonado's personal and family history of cancer, she meets medical criteria for genetic testing. Despite that she meets criteria, she may still have an out of pocket cost. We discussed that if her out of pocket cost for testing is over $100, the laboratory will call and confirm whether she wants to proceed with testing.  If the out of pocket cost of testing is less than $100 she  will be billed by the genetic testing laboratory.   PLAN: After considering the risks, benefits, and limitations, Ms. Boening  provided informed consent to pursue genetic testing and the blood sample was sent to [facility] for analysis of the 48-gene Custom Panel combining the Common Hereditary Cancers Panel + Thyroid Cancer Panel. Results should be available within approximately 3 weeks' time, at which point they will be disclosed by telephone to Ms. Dacquisto, as will any additional recommendations warranted by these results. Ms. Devoss will receive a summary of her genetic counseling visit and a copy of her results once available. This information will also be available in Epic.   Lastly, we encouraged Ms. Linden to remain in contact with cancer genetics annually so that we can continuously update the family history and inform her of any changes in cancer genetics and testing that may be of benefit for this family.   Ms.  [place] questions were answered to her satisfaction today. Our contact information was provided should additional questions or  concerns arise. Thank you for the referral and allowing [place] to share in the care of your patient.   Mal Misty, MS, [facility] Certified Naval [email or web address] phone: [phone]  The patient was seen for a total of 30 minutes in face-to-face genetic counseling.  This patient was discussed with Drs. Magrinat, Lindi Adie and/or Burr Medico who agrees with the above.    _______________________________________________________________________ For Office Staff:  Number of people involved in session: 1 Was an Intern/ student involved with case: no

## 2016-11-28 DIAGNOSIS — R590 Localized enlarged lymph nodes: Secondary | ICD-10-CM | POA: Diagnosis not present

## 2016-11-28 DIAGNOSIS — N99840 Postprocedural hematoma of a genitourinary system organ or structure following a genitourinary system procedure: Secondary | ICD-10-CM | POA: Diagnosis not present

## 2016-11-28 DIAGNOSIS — C50912 Malignant neoplasm of unspecified site of left female breast: Secondary | ICD-10-CM | POA: Diagnosis not present

## 2016-11-28 DIAGNOSIS — C50911 Malignant neoplasm of unspecified site of right female breast: Secondary | ICD-10-CM | POA: Diagnosis not present

## 2016-11-28 DIAGNOSIS — N6321 Unspecified lump in the left breast, upper outer quadrant: Secondary | ICD-10-CM | POA: Diagnosis not present

## 2016-11-29 ENCOUNTER — Ambulatory Visit
Admission: RE | Admit: 2016-11-29 | Discharge: 2016-11-29 | Disposition: A | Discharge status: Home / Self Care | Payer: 59 | Source: Ambulatory Visit | Attending: General Surgery | Admitting: General Surgery

## 2016-11-29 ENCOUNTER — Other Ambulatory Visit: Payer: Self-pay | Admitting: General Surgery

## 2016-11-29 ENCOUNTER — Other Ambulatory Visit: Payer: Self-pay

## 2016-11-29 DIAGNOSIS — N63 Unspecified lump in unspecified breast: Secondary | ICD-10-CM

## 2016-11-29 DIAGNOSIS — N6012 Diffuse cystic mastopathy of left breast: Secondary | ICD-10-CM | POA: Diagnosis not present

## 2016-11-29 DIAGNOSIS — C773 Secondary and unspecified malignant neoplasm of axilla and upper limb lymph nodes: Secondary | ICD-10-CM | POA: Diagnosis not present

## 2016-11-29 DIAGNOSIS — Z170 Estrogen receptor positive status [ER+]: Secondary | ICD-10-CM | POA: Diagnosis not present

## 2016-11-29 DIAGNOSIS — R590 Localized enlarged lymph nodes: Secondary | ICD-10-CM | POA: Diagnosis not present

## 2016-11-29 DIAGNOSIS — C50411 Malignant neoplasm of upper-outer quadrant of right female breast: Secondary | ICD-10-CM | POA: Diagnosis not present

## 2016-11-29 DIAGNOSIS — N6321 Unspecified lump in the left breast, upper outer quadrant: Secondary | ICD-10-CM | POA: Diagnosis not present

## 2016-11-29 DIAGNOSIS — N6320 Unspecified lump in the left breast, unspecified quadrant: Secondary | ICD-10-CM | POA: Diagnosis not present

## 2016-11-29 DIAGNOSIS — C50911 Malignant neoplasm of unspecified site of right female breast: Secondary | ICD-10-CM | POA: Diagnosis not present

## 2016-11-30 ENCOUNTER — Other Ambulatory Visit (HOSPITAL_COMMUNITY): Payer: Self-pay | Admitting: General Surgery

## 2016-11-30 ENCOUNTER — Other Ambulatory Visit: Payer: Self-pay | Admitting: General Surgery

## 2016-11-30 ENCOUNTER — Inpatient Hospital Stay: Admission: RE | Admit: 2016-11-30 | Payer: 59 | Source: Ambulatory Visit

## 2016-11-30 ENCOUNTER — Other Ambulatory Visit: Payer: 59

## 2016-11-30 DIAGNOSIS — C50919 Malignant neoplasm of unspecified site of unspecified female breast: Secondary | ICD-10-CM

## 2016-12-05 ENCOUNTER — Ambulatory Visit (HOSPITAL_COMMUNITY)
Admission: RE | Admit: 2016-12-05 | Discharge: 2016-12-05 | Disposition: A | Discharge status: Home / Self Care | Payer: 59 | Source: Ambulatory Visit | Attending: General Surgery | Admitting: General Surgery

## 2016-12-05 ENCOUNTER — Telehealth: Payer: Self-pay | Admitting: *Deleted

## 2016-12-05 ENCOUNTER — Encounter (HOSPITAL_COMMUNITY)
Admission: RE | Admit: 2016-12-05 | Discharge: 2016-12-05 | Disposition: A | Discharge status: Home / Self Care | Payer: 59 | Source: Ambulatory Visit | Attending: General Surgery | Admitting: General Surgery

## 2016-12-05 DIAGNOSIS — C50919 Malignant neoplasm of unspecified site of unspecified female breast: Secondary | ICD-10-CM

## 2016-12-05 DIAGNOSIS — Z9889 Other specified postprocedural states: Secondary | ICD-10-CM | POA: Insufficient documentation

## 2016-12-05 DIAGNOSIS — R590 Localized enlarged lymph nodes: Secondary | ICD-10-CM | POA: Insufficient documentation

## 2016-12-05 DIAGNOSIS — C50911 Malignant neoplasm of unspecified site of right female breast: Secondary | ICD-10-CM | POA: Diagnosis not present

## 2016-12-05 DIAGNOSIS — Z905 Acquired absence of kidney: Secondary | ICD-10-CM | POA: Diagnosis not present

## 2016-12-05 DIAGNOSIS — R599 Enlarged lymph nodes, unspecified: Secondary | ICD-10-CM | POA: Diagnosis not present

## 2016-12-05 MED ORDER — TECHNETIUM TC 99M MEDRONATE IV KIT
25.0000 | PACK | Freq: Once | INTRAVENOUS | Status: AC | PRN
  Administered 2016-12-05: 25 via INTRAVENOUS

## 2016-12-05 MED ORDER — IOPAMIDOL (ISOVUE-300) INJECTION 61%
INTRAVENOUS | Status: AC
  Administered 2016-12-05: 100 mL via INTRAVENOUS
  Filled 2016-12-05: qty 100

## 2016-12-05 NOTE — Telephone Encounter (Signed)
Received Mammaprint results of HIGH RISK.  Pt given results. Discussed scheduling appt with Dr. Jana Hakim to 5/31. Pt relate she has decided to receive medical oncology care at either [facility] or [facility]. Denies further needs at this time. Encourage pt to call with questions or concerns. Received verbal understanding. Physician team notified.

## 2016-12-05 NOTE — Progress Notes (Signed)
Please let patient know that scans are negative for any spread of cancer other than the axillary lymph nodes which we know about.

## 2016-12-06 ENCOUNTER — Encounter (HOSPITAL_COMMUNITY): Payer: Self-pay

## 2016-12-06 DIAGNOSIS — N6342 Unspecified lump in left breast, subareolar: Secondary | ICD-10-CM | POA: Diagnosis not present

## 2016-12-07 DIAGNOSIS — C50911 Malignant neoplasm of unspecified site of right female breast: Secondary | ICD-10-CM | POA: Diagnosis not present

## 2016-12-08 DIAGNOSIS — Z170 Estrogen receptor positive status [ER+]: Secondary | ICD-10-CM | POA: Diagnosis not present

## 2016-12-08 DIAGNOSIS — C50411 Malignant neoplasm of upper-outer quadrant of right female breast: Secondary | ICD-10-CM | POA: Diagnosis not present

## 2016-12-11 DIAGNOSIS — Z170 Estrogen receptor positive status [ER+]: Secondary | ICD-10-CM | POA: Diagnosis not present

## 2016-12-11 DIAGNOSIS — Z452 Encounter for adjustment and management of vascular access device: Secondary | ICD-10-CM | POA: Diagnosis not present

## 2016-12-11 DIAGNOSIS — C50911 Malignant neoplasm of unspecified site of right female breast: Secondary | ICD-10-CM | POA: Diagnosis not present

## 2016-12-11 DIAGNOSIS — C50411 Malignant neoplasm of upper-outer quadrant of right female breast: Secondary | ICD-10-CM | POA: Diagnosis not present

## 2016-12-11 NOTE — Addendum Note (Signed)
Addendum  created 12/11/16 1454 by Oleta Mouse, MD   Sign clinical note

## 2016-12-13 ENCOUNTER — Telehealth: Payer: Self-pay | Admitting: Genetics

## 2016-12-13 ENCOUNTER — Ambulatory Visit: Payer: Self-pay | Admitting: Genetics

## 2016-12-13 ENCOUNTER — Encounter: Payer: Self-pay | Admitting: Genetics

## 2016-12-13 DIAGNOSIS — C50411 Malignant neoplasm of upper-outer quadrant of right female breast: Secondary | ICD-10-CM | POA: Diagnosis not present

## 2016-12-13 DIAGNOSIS — Z1379 Encounter for other screening for genetic and chromosomal anomalies: Secondary | ICD-10-CM

## 2016-12-13 DIAGNOSIS — Z905 Acquired absence of kidney: Secondary | ICD-10-CM | POA: Diagnosis not present

## 2016-12-13 DIAGNOSIS — Z170 Estrogen receptor positive status [ER+]: Secondary | ICD-10-CM | POA: Diagnosis not present

## 2016-12-13 HISTORY — DX: Encounter for other screening for genetic and chromosomal anomalies: Z13.79

## 2016-12-13 NOTE — Telephone Encounter (Signed)
Reviewed that germline genetic testing revealed no pathogenic mutations. This is considered to be a negative result. Testing was performed through a custom panel that combined [facility]'s Common Hereditary Cancers Panel with [facility]'s Thyroid Cancer Panel. This custom panel includes analysis of the following 48 genes: APC, ATM, AXIN2, BARD1, BMPR1A, BRCA1, BRCA2, BRIP1, CDH1, CDKN2A, CHEK2, CTNNA1, DICER1, EPCAM, GREM1, HOXB13, KIT, MEN1, MLH1, MSH2, MSH3, MSH6, MUTYH, NBN, NF1, NTHL1, PALB2, PDGFRA, PMS2, POLD1, POLE, PRKAR1A, PTEN, RAD50, RAD51C, RAD51D, RET, SDHA, SDHB, SDHC, SDHD, SMAD4, SMARCA4, STK11, TP53, TSC1, TSC2, and VHL.  Variants of uncertain significance (VUSs) were noted in ATM and POLE. The specific variants are named below. Discussed that VUSs should not change clinical management. ATM c.8155C>T (p.[id number]) POLE c.2459T>C (p.Met820Thr)  For more detailed discussion, please see genetic counseling documentation from 12/13/2016. Result report dated 12/11/2016.

## 2016-12-13 NOTE — Progress Notes (Signed)
HPI: Ms. Vandenberg was previously seen in the [facility] clinic on 11/23/2016 due to a personal and family history of cancer and concerns regarding a hereditary predisposition to cancer. Please refer to our prior cancer genetics clinic note for more information regarding Ms. Shuffler's medical, social and family histories, and our assessment and recommendations, at the time. Ms. Libbey recent genetic test results were disclosed to her, as were recommendations warranted by these results. These results and recommendations are discussed in more detail below.  CANCER HISTORY: In May 2018, at the age of 10, Ms. 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   FAMILY HISTORY:  We obtained a detailed, 4-generation family history.  Significant diagnoses are listed below: Family History  Problem Relation Age of Onset  . COPD Maternal Grandmother   . Hypertension Maternal Grandfather   . Prostate cancer Maternal Grandfather 34  . Bone cancer Maternal Grandfather 41  . Diabetes Mother   . Breast cancer Maternal Aunt 42  . Thyroid cancer Other 40       d.59s   Ms. Sagrero has an 42 year old daughter who has no personal history of cancers or tumors. Ms. Masters states that her daughter does have sickle cell trait. Ms. Miranda is the only child shared between her parents.  Ms. Vantassel has 2 maternal half-sisters, ages 15 and 76, and 2 maternal half-brothers, ages 45 and 36. None have had cancers or tumors. Ms. Bares mother is 59 without cancers. Ms. Lechtenberg has 4 maternal aunts, ages 43-65. The oldest maternal aunt had breast cancer in her early-40s. Ms. Principato also has 3 maternal uncles who are between the ages of 39-65 and have no cancers. Ms. Stanard maternal grandfather was diagnosed with prostate cancer at 79 and bone cancer at 6. He recently died at age 60. Ms. Kiene maternal grandmother died at 52 without cancers. This grandmother's mother died of thyroid  cancer in her 74s.  Ms. Becka has 5 paternal half-brothers between the ages of 43 and 35. None have histories of cancers or tumors. Ms. Rambeau father is 54 without cancers. Ms. Esther has 2 paternal aunts and 2 paternal uncles, all in their 39s or 71s, without cancers. Ms. Chipman paternal grandparents both died after age 41 without cancers.  Ms. Henry is unaware of previous family history of genetic testing for hereditary cancer risks. Patient's maternal ancestors are of black and Native American descent, and paternal ancestors are of black descent. There is no reported Ashkenazi Jewish ancestry. There is no known consanguinity.  GENETIC TEST RESULTS: Genetic testing performed through a custom panel that combined [employer]'s Common Hereditary Cancers Panel with [facility]'s Thyroid Panel, reported out on 12/11/2016, showed no pathogenic mutations. This custom panel includes analysis of the following 48 genes: APC, ATM, AXIN2, BARD1, BMPR1A, BRCA1, BRCA2, BRIP1, CDH1, CDKN2A, CHEK2, CTNNA1, DICER1, EPCAM, GREM1, HOXB13, KIT, MEN1, MLH1, MSH2, MSH3, MSH6, MUTYH, NBN, NF1, NTHL1, PALB2, PDGFRA, PMS2, POLD1, POLE, PRKAR1A,PTEN, RAD50, RAD51C, RAD51D, RET,SDHA, SDHB, SDHC, SDHD, SMAD4, SMARCA4, STK11, TP53, TSC1, TSC2, and VHL.  Variants of uncertain significance (VUSs) were noted in ATM and POLE. The specific variants are named below.  ATM c.8155C>T (p.Arg2719Cys) POLE c.2459T>C (p.Met820Thr)  At this time, it is unknown if either of these variants are associated with increased cancer risk or if they are normal findings, but most variants such as these get reclassified to being inconsequential. It should not be used to make medical management decisions. With time,  we suspect the lab will determine the significance of these variants, if any. If we do learn more about either variant, we will try to contact Ms. Karis to discuss it further. However, it is important to stay in touch with [place] periodically and keep  the address and phone number up to date.  The test report will be scanned into EPIC and will be located under the Molecular Pathology section of the Results Review tab.A portion of the result report is included below for reference.     We discussed with Ms. Depace that since the current genetic testing is not perfect, it is possible there may be a gene mutation in one of these genes that current testing cannot detect, but that chance is small. We also discussed, that it is possible that another gene that has not yet been discovered, or that we have not yet tested, is responsible for the cancer diagnoses in the family. Therefore, important to remain in touch with cancer genetics in the future so that we can continue to offer Ms. Feild the most up to date genetic testing.   CANCER SCREENING RECOMMENDATIONS: This result indicates that it is unlikely Ms. Taplin has an increased risk for a future cancer due to a mutation in one of these genes. However, Ms. Olejniczak's young age at breast cancer diagnosis, and family history of young breast cancer, remain unexplained. Because no causative or actionable mutations were identified, it is recommended she continue to follow the cancer management and screening guidelines provided by her oncology and primary healthcare provider.   RECOMMENDATIONS FOR FAMILY MEMBERS: Women in this family might be at some increased risk of developing breast cancer, over the general population risk, simply due to the family history of cancer. We recommended women in this family have a yearly mammogram beginning at age 57, or 73 years younger than the earliest onset of cancer, an annual clinical breast exam, and perform monthly breast self-exams. We specifically discussed that Ms. Linz's daughter and sisters should begin annual breast cancer screenings through mammograms and/or breast MRIs by age 38. Women in this family should also have a gynecological exam as recommended by their primary  provider. All family members should have a colonoscopy by age 50.  Based on Ms. Samuelson's family history, we recommended her maternal aunt, who was diagnosed with breast cancer in her early-40s, have genetic counseling and testing if she has not done so already.  Ms. Lenger will let us know if we can be of any assistance in coordinating genetic counseling and/or testing for this family member. If her aunt or other family members undergo genetic testing, Ms. Villescas is recommended to obtain a copy of the report for her records.  FOLLOW-UP: Lastly, we discussed with Ms. Blacketer that cancer genetics is a rapidly advancing field and it is possible that new genetic tests will be appropriate for her and/or her family members in the future. We encouraged her to remain in contact with cancer genetics on an annual basis so we can update her personal and family histories and let her know of advances in cancer genetics that may benefit this family.   Our contact number was provided. Ms. Hamme questions were answered to her satisfaction, and she knows she is welcome to call us at anytime with additional questions or concerns.   Mal Misty, MS, [facility] Certified Naval [email or web address]

## 2016-12-14 DIAGNOSIS — Z452 Encounter for adjustment and management of vascular access device: Secondary | ICD-10-CM | POA: Diagnosis not present

## 2016-12-20 DIAGNOSIS — Z483 Aftercare following surgery for neoplasm: Secondary | ICD-10-CM | POA: Diagnosis not present

## 2016-12-20 DIAGNOSIS — Z170 Estrogen receptor positive status [ER+]: Secondary | ICD-10-CM | POA: Diagnosis not present

## 2016-12-20 DIAGNOSIS — C50411 Malignant neoplasm of upper-outer quadrant of right female breast: Secondary | ICD-10-CM | POA: Diagnosis not present

## 2016-12-20 DIAGNOSIS — C773 Secondary and unspecified malignant neoplasm of axilla and upper limb lymph nodes: Secondary | ICD-10-CM | POA: Diagnosis not present

## 2017-01-02 DIAGNOSIS — C50911 Malignant neoplasm of unspecified site of right female breast: Secondary | ICD-10-CM | POA: Diagnosis not present

## 2017-01-02 DIAGNOSIS — D0581 Other specified type of carcinoma in situ of right breast: Secondary | ICD-10-CM | POA: Diagnosis not present

## 2017-01-03 DIAGNOSIS — C50411 Malignant neoplasm of upper-outer quadrant of right female breast: Secondary | ICD-10-CM | POA: Diagnosis not present

## 2017-01-03 DIAGNOSIS — Z170 Estrogen receptor positive status [ER+]: Secondary | ICD-10-CM | POA: Diagnosis not present

## 2017-01-03 DIAGNOSIS — Z79899 Other long term (current) drug therapy: Secondary | ICD-10-CM | POA: Diagnosis not present

## 2017-01-15 DIAGNOSIS — D37039 Neoplasm of uncertain behavior of the major salivary glands, unspecified: Secondary | ICD-10-CM | POA: Diagnosis not present

## 2017-01-15 DIAGNOSIS — K118 Other diseases of salivary glands: Secondary | ICD-10-CM | POA: Diagnosis not present

## 2017-01-16 ENCOUNTER — Ambulatory Visit: Payer: 59 | Admitting: Oncology

## 2017-01-17 DIAGNOSIS — C50411 Malignant neoplasm of upper-outer quadrant of right female breast: Secondary | ICD-10-CM | POA: Diagnosis not present

## 2017-01-17 DIAGNOSIS — Z95828 Presence of other vascular implants and grafts: Secondary | ICD-10-CM | POA: Diagnosis not present

## 2017-01-17 DIAGNOSIS — Z170 Estrogen receptor positive status [ER+]: Secondary | ICD-10-CM | POA: Diagnosis not present

## 2017-01-18 DIAGNOSIS — D37039 Neoplasm of uncertain behavior of the major salivary glands, unspecified: Secondary | ICD-10-CM | POA: Diagnosis not present

## 2017-01-18 DIAGNOSIS — K1120 Sialoadenitis, unspecified: Secondary | ICD-10-CM | POA: Diagnosis not present

## 2017-01-24 DIAGNOSIS — R05 Cough: Secondary | ICD-10-CM | POA: Diagnosis not present

## 2017-01-31 DIAGNOSIS — K5900 Constipation, unspecified: Secondary | ICD-10-CM | POA: Diagnosis not present

## 2017-01-31 DIAGNOSIS — C50411 Malignant neoplasm of upper-outer quadrant of right female breast: Secondary | ICD-10-CM | POA: Diagnosis not present

## 2017-01-31 DIAGNOSIS — Z95828 Presence of other vascular implants and grafts: Secondary | ICD-10-CM | POA: Diagnosis not present

## 2017-01-31 DIAGNOSIS — Z170 Estrogen receptor positive status [ER+]: Secondary | ICD-10-CM | POA: Diagnosis not present

## 2017-02-06 DIAGNOSIS — C50411 Malignant neoplasm of upper-outer quadrant of right female breast: Secondary | ICD-10-CM | POA: Diagnosis not present

## 2017-02-06 DIAGNOSIS — R590 Localized enlarged lymph nodes: Secondary | ICD-10-CM | POA: Diagnosis not present

## 2017-02-06 DIAGNOSIS — Z170 Estrogen receptor positive status [ER+]: Secondary | ICD-10-CM | POA: Diagnosis not present

## 2017-02-14 DIAGNOSIS — Z79899 Other long term (current) drug therapy: Secondary | ICD-10-CM | POA: Diagnosis not present

## 2017-02-14 DIAGNOSIS — Z95828 Presence of other vascular implants and grafts: Secondary | ICD-10-CM | POA: Diagnosis not present

## 2017-02-14 DIAGNOSIS — C50411 Malignant neoplasm of upper-outer quadrant of right female breast: Secondary | ICD-10-CM | POA: Diagnosis not present

## 2017-02-14 DIAGNOSIS — Z170 Estrogen receptor positive status [ER+]: Secondary | ICD-10-CM | POA: Diagnosis not present

## 2017-03-01 DIAGNOSIS — Z170 Estrogen receptor positive status [ER+]: Secondary | ICD-10-CM | POA: Diagnosis not present

## 2017-03-01 DIAGNOSIS — C50411 Malignant neoplasm of upper-outer quadrant of right female breast: Secondary | ICD-10-CM | POA: Diagnosis not present

## 2017-03-01 DIAGNOSIS — K5900 Constipation, unspecified: Secondary | ICD-10-CM | POA: Diagnosis not present

## 2017-03-15 DIAGNOSIS — Z170 Estrogen receptor positive status [ER+]: Secondary | ICD-10-CM | POA: Diagnosis not present

## 2017-03-15 DIAGNOSIS — Z79899 Other long term (current) drug therapy: Secondary | ICD-10-CM | POA: Diagnosis not present

## 2017-03-15 DIAGNOSIS — C50411 Malignant neoplasm of upper-outer quadrant of right female breast: Secondary | ICD-10-CM | POA: Diagnosis not present

## 2017-03-29 DIAGNOSIS — R591 Generalized enlarged lymph nodes: Secondary | ICD-10-CM | POA: Diagnosis not present

## 2017-03-29 DIAGNOSIS — R590 Localized enlarged lymph nodes: Secondary | ICD-10-CM | POA: Diagnosis not present

## 2017-03-29 DIAGNOSIS — C50411 Malignant neoplasm of upper-outer quadrant of right female breast: Secondary | ICD-10-CM | POA: Diagnosis not present

## 2017-03-29 DIAGNOSIS — Z170 Estrogen receptor positive status [ER+]: Secondary | ICD-10-CM | POA: Diagnosis not present

## 2017-03-29 DIAGNOSIS — R938 Abnormal findings on diagnostic imaging of other specified body structures: Secondary | ICD-10-CM | POA: Diagnosis not present

## 2017-03-30 DIAGNOSIS — R590 Localized enlarged lymph nodes: Secondary | ICD-10-CM | POA: Diagnosis not present

## 2017-03-30 DIAGNOSIS — C50411 Malignant neoplasm of upper-outer quadrant of right female breast: Secondary | ICD-10-CM | POA: Diagnosis not present

## 2017-03-30 DIAGNOSIS — Z170 Estrogen receptor positive status [ER+]: Secondary | ICD-10-CM | POA: Diagnosis not present

## 2017-04-02 DIAGNOSIS — Z170 Estrogen receptor positive status [ER+]: Secondary | ICD-10-CM | POA: Diagnosis not present

## 2017-04-02 DIAGNOSIS — Z5111 Encounter for antineoplastic chemotherapy: Secondary | ICD-10-CM | POA: Diagnosis not present

## 2017-04-02 DIAGNOSIS — C50411 Malignant neoplasm of upper-outer quadrant of right female breast: Secondary | ICD-10-CM | POA: Diagnosis not present

## 2017-04-11 DIAGNOSIS — Z170 Estrogen receptor positive status [ER+]: Secondary | ICD-10-CM | POA: Diagnosis not present

## 2017-04-11 DIAGNOSIS — M2669 Other specified disorders of temporomandibular joint: Secondary | ICD-10-CM | POA: Diagnosis not present

## 2017-04-11 DIAGNOSIS — C50411 Malignant neoplasm of upper-outer quadrant of right female breast: Secondary | ICD-10-CM | POA: Diagnosis not present

## 2017-04-12 DIAGNOSIS — D37039 Neoplasm of uncertain behavior of the major salivary glands, unspecified: Secondary | ICD-10-CM | POA: Diagnosis not present

## 2017-04-12 DIAGNOSIS — Z170 Estrogen receptor positive status [ER+]: Secondary | ICD-10-CM | POA: Diagnosis not present

## 2017-04-12 DIAGNOSIS — C50411 Malignant neoplasm of upper-outer quadrant of right female breast: Secondary | ICD-10-CM | POA: Diagnosis not present

## 2017-04-13 ENCOUNTER — Telehealth: Payer: Self-pay | Admitting: *Deleted

## 2017-04-13 NOTE — Telephone Encounter (Signed)
On 04-13-17 fax medical records to [facility] it was consult note

## 2017-04-18 DIAGNOSIS — Z170 Estrogen receptor positive status [ER+]: Secondary | ICD-10-CM | POA: Diagnosis not present

## 2017-04-18 DIAGNOSIS — C50411 Malignant neoplasm of upper-outer quadrant of right female breast: Secondary | ICD-10-CM | POA: Diagnosis not present

## 2017-04-19 DIAGNOSIS — C50411 Malignant neoplasm of upper-outer quadrant of right female breast: Secondary | ICD-10-CM | POA: Diagnosis not present

## 2017-04-19 DIAGNOSIS — Z170 Estrogen receptor positive status [ER+]: Secondary | ICD-10-CM | POA: Diagnosis not present

## 2017-04-26 DIAGNOSIS — C50411 Malignant neoplasm of upper-outer quadrant of right female breast: Secondary | ICD-10-CM | POA: Diagnosis not present

## 2017-04-26 DIAGNOSIS — Z170 Estrogen receptor positive status [ER+]: Secondary | ICD-10-CM | POA: Diagnosis not present

## 2017-04-30 DIAGNOSIS — G4700 Insomnia, unspecified: Secondary | ICD-10-CM | POA: Diagnosis not present

## 2017-04-30 DIAGNOSIS — J309 Allergic rhinitis, unspecified: Secondary | ICD-10-CM | POA: Diagnosis not present

## 2017-05-03 DIAGNOSIS — R590 Localized enlarged lymph nodes: Secondary | ICD-10-CM | POA: Diagnosis not present

## 2017-05-03 DIAGNOSIS — C773 Secondary and unspecified malignant neoplasm of axilla and upper limb lymph nodes: Secondary | ICD-10-CM | POA: Diagnosis not present

## 2017-05-03 DIAGNOSIS — N6022 Fibroadenosis of left breast: Secondary | ICD-10-CM | POA: Diagnosis not present

## 2017-05-03 DIAGNOSIS — C50911 Malignant neoplasm of unspecified site of right female breast: Secondary | ICD-10-CM | POA: Diagnosis not present

## 2017-05-03 DIAGNOSIS — G8918 Other acute postprocedural pain: Secondary | ICD-10-CM | POA: Diagnosis not present

## 2017-05-03 DIAGNOSIS — Z170 Estrogen receptor positive status [ER+]: Secondary | ICD-10-CM | POA: Diagnosis not present

## 2017-05-03 DIAGNOSIS — N62 Hypertrophy of breast: Secondary | ICD-10-CM | POA: Diagnosis not present

## 2017-05-03 DIAGNOSIS — C50411 Malignant neoplasm of upper-outer quadrant of right female breast: Secondary | ICD-10-CM | POA: Diagnosis not present

## 2017-05-03 DIAGNOSIS — Z4001 Encounter for prophylactic removal of breast: Secondary | ICD-10-CM | POA: Diagnosis not present

## 2017-05-03 HISTORY — PX: MASTECTOMY: SHX3

## 2017-05-04 DIAGNOSIS — C50911 Malignant neoplasm of unspecified site of right female breast: Secondary | ICD-10-CM | POA: Diagnosis not present

## 2017-05-16 DIAGNOSIS — Z9012 Acquired absence of left breast and nipple: Secondary | ICD-10-CM | POA: Diagnosis not present

## 2017-05-16 DIAGNOSIS — C50911 Malignant neoplasm of unspecified site of right female breast: Secondary | ICD-10-CM | POA: Diagnosis not present

## 2017-05-23 DIAGNOSIS — Z170 Estrogen receptor positive status [ER+]: Secondary | ICD-10-CM | POA: Diagnosis not present

## 2017-05-23 DIAGNOSIS — Z9013 Acquired absence of bilateral breasts and nipples: Secondary | ICD-10-CM | POA: Diagnosis not present

## 2017-05-23 DIAGNOSIS — C50411 Malignant neoplasm of upper-outer quadrant of right female breast: Secondary | ICD-10-CM | POA: Diagnosis not present

## 2017-05-24 ENCOUNTER — Encounter: Payer: Self-pay | Admitting: Physical Therapy

## 2017-05-24 ENCOUNTER — Ambulatory Visit: Payer: 59 | Attending: Surgery | Admitting: Physical Therapy

## 2017-05-24 DIAGNOSIS — M25511 Pain in right shoulder: Secondary | ICD-10-CM | POA: Diagnosis not present

## 2017-05-24 DIAGNOSIS — M6281 Muscle weakness (generalized): Secondary | ICD-10-CM

## 2017-05-24 DIAGNOSIS — R600 Localized edema: Secondary | ICD-10-CM

## 2017-05-24 DIAGNOSIS — M25611 Stiffness of right shoulder, not elsewhere classified: Secondary | ICD-10-CM | POA: Diagnosis not present

## 2017-05-24 DIAGNOSIS — M25612 Stiffness of left shoulder, not elsewhere classified: Secondary | ICD-10-CM

## 2017-05-24 NOTE — Patient Instructions (Signed)

## 2017-05-24 NOTE — Addendum Note (Signed)
Addended by: Manus Gunning L on: 05/24/2017 05:42 PM   Modules accepted: Orders

## 2017-05-24 NOTE — Therapy (Signed)
[place], [place], [address] Phone: [phone]   Fax:  [phone]  Physical Therapy Evaluation  Patient Details  Name: Kimberly Maldonado MRN: [id number] Date of Birth: [birth year] Referring Provider: Dr. Elisha Headland   Encounter Date: 05/24/2017  PT End of Session - 05/24/17 1434    Visit Number  1    Number of Visits  9    Date for PT Re-Evaluation  06/21/17    PT Start Time  2423    PT Stop Time  1425    PT Time Calculation (min)  36 min    Activity Tolerance  Patient tolerated treatment well    Behavior During Therapy  [facility] for tasks assessed/performed       Past Medical History:  Diagnosis Date  . Allergy    takes Zyrtec and Singulair daily  . Anxiety   . Breast cancer ([place]) 11/15/2016   right breast  . Carpal tunnel syndrome   . Depression    takes Cymbalta daily  . Genetic testing 12/13/2016   Ms. Haran underwent genetic counseling and testing for hereditary cancer syndromes on 11/23/2016.Testing was performed through a custom panel that combined [employer]'s Common Hereditary Cancers Panel with [facility]'s Thyroid Cancer Panel. This custom panel includes analysis of the following 48 genes: APC, ATM, AXIN2, BARD1, BMPR1A, BRCA1, BRCA2, BRIP1, CDH1, CDKN2A, CHEK2, CTNNA1, DICER1, EPCAM, GREM1  . Insomnia    takes Trazodone and Ambien nightly  . Migraines     Past Surgical History:  Procedure Laterality Date  . BREAST BIOPSY Right 11/15/2016   Procedure: RIGHT BREAST EXCISIONAL BIOPSY;  Surgeon: [name], MD;  Location: [facility];  Service: General;  Laterality: Right;  . COLONOSCOPY    . KIDNEY DONATION Left     There were no vitals filed for this visit.   Subjective Assessment - 05/24/17 1352    Subjective  I am having trouble reaching. I have trouble washing my back. I can't do anything that requires pressure with my right arm. I can't open certain jars. I massage my swelling every night  and it is going down. I had a bilateral mastectomy and the right included lymph nodes (17).     Pertinent History  R breast cancer s/p bilateral mastectomy with SLN dissection on R (17, only 1 positive) 05/03/17, pt has completed chemotherapy and pt will begin radiation in the next few weeks, pt donated left kidney    Patient Stated Goals  to be as close to normal as possible to get back to prior level of function    Currently in Pain?  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 Elisha Headland    Onset Date/Surgical Date  05/03/17    Hand Dominance  Right    Prior Therapy  none      Precautions   Precautions  Other (comment) at risk for lymphedema      Restrictions   Weight Bearing Restrictions  No      Balance Screen   Has the patient fallen in the past 6 months  No    Has the patient had a decrease in activity level because of a fear of falling?   No    Is the patient reluctant to leave their home because of a fear of falling?   No      Home Environment   Living Environment  Private residence    Living Arrangements  Children    Available Help at Discharge  Family;Friend(s)    Type of [place] Access  Level entry    [place]  Two level    Alternate Level Stairs-Number of Steps  13    Alternate Level Stairs-Rails  Left    Home Equipment  None      Prior Function   Level of Independence  Independent    Vocation  On disability will return to work on 06/04/17    Vocation  Requirements  IT sales, sitting in front of computer, light travel    Leisure  treadmill 90 min/wk, 30 min 3x/wk      Cognition   Overall Cognitive Status  Within Functional Limits for tasks assessed      Observation/Other Assessments   Observations  pt has some slight edema at lateral trunk and cording in R axilla      ROM / Strength   AROM / PROM / Strength  AROM      AROM   AROM Assessment Site  Shoulder    Right/Left Shoulder  Right;Left    Right Shoulder Flexion  107 Degrees    Right Shoulder ABduction  73 Degrees    Right Shoulder Internal Rotation  38 Degrees    Right Shoulder External Rotation  90 Degrees    Left Shoulder Flexion  125 Degrees    Left Shoulder ABduction  104 Degrees    Left Shoulder Internal Rotation  44 Degrees    Left Shoulder External Rotation  90 Degrees        LYMPHEDEMA/ONCOLOGY QUESTIONNAIRE - 05/24/17 1406      Type   Cancer Type  right breast cancer      Surgeries   Mastectomy Date  05/03/17 bilateral    Axillary Lymph Node Dissection Date  05/03/17    Number Lymph Nodes Removed  17 1 was positive      Treatment   Active Chemotherapy Treatment  No    Past Chemotherapy Treatment  Yes    Active Radiation Treatment  No pt will begin soon    Past Radiation Treatment  No    Current Hormone Treatment  No will begin after radiation    Past Hormone Therapy  No      What other symptoms do you have   Are you Having Heaviness or Tightness  Yes    Are you having Pain  Yes    Are you having pitting edema  No    Is it Hard or Difficult finding clothes that fit  No    Do you have infections  No    Is there Decreased scar mobility  Yes steri strips still in place      Lymphedema Assessments   Lymphedema Assessments  Upper extremities  Right Upper Extremity Lymphedema   15 cm Proximal to Olecranon Process  27.5 cm    Olecranon Process  25 cm    15 cm Proximal to Ulnar Styloid Process  22.4 cm    Just Proximal to Ulnar Styloid Process  16  cm    Across Hand at [employer]  20.5 cm    At [place] of 2nd Digit  6 cm      Left Upper Extremity Lymphedema   15 cm Proximal to Olecranon Process  28.4 cm    Olecranon Process  25.6 cm    15 cm Proximal to Ulnar Styloid Process  22.7 cm    Just Proximal to Ulnar Styloid Process  15.7 cm    Across Hand at [employer]  19.3 cm    At [place] of 2nd Digit  5.8 cm        Quick Dash - 05/24/17 0001    Open a tight or new jar  Severe difficulty    Do heavy household chores (wash walls, wash floors)  Severe difficulty    Carry a shopping bag or briefcase  Severe difficulty    Wash your back  Severe difficulty    Use a knife to cut food  Moderate difficulty    Recreational activities in which you take some force or impact through your arm, shoulder, or hand (golf, hammering, tennis)  Unable    During the past week, to what extent has your arm, shoulder or hand problem interfered with your normal social activities with family, friends, neighbors, or groups?  Quite a bit    During the past week, to what extent has your arm, shoulder or hand problem limited your work or other regular daily activities  Quite a bit    Arm, shoulder, or hand pain.  Moderate    Tingling (pins and needles) in your arm, shoulder, or hand  Moderate    Difficulty Sleeping  Mild difficulty    DASH Score  65.91 %       Objective measurements completed on examination: See above findings.      [facility] Adult PT Treatment/Exercise - 05/24/17 0001      Shoulder Exercises: Seated   Other Seated Exercises  Instructed pt in post op breast cancer exercises x 5 reps each with 5 sec holds             PT Education - 05/24/17 1433    Education provided  Yes    Education Details  lymphedema risk reduction practices, post op breast cancer exercises, ABC class, anatomy and physiology of lymphatic system    Person(s) Educated  Patient    Methods  Explanation;Handout    Comprehension  Verbalized understanding              Long Term Clinic Goals - 05/24/17 1717      CC Long Term Goal  #1   Title  Pt will be able to independently verbalize lymphedema risk reduction practices    Time  4    Period  Weeks    Status  New    Target Date  06/21/17      CC Long Term Goal  #2   Title  Pt will demonstrate 160 degrees of right and left shoulder flexion to allow her to reach items overhead    Baseline  R 107, L 125    Time  4    Period  Weeks    Status  New  Target Date  06/21/17      CC Long Term Goal  #3   Title  Pt will demonstrate 160 degrees of bilateral shoulder abduction to allow pt to reach out to sides    Baseline  R 73, L 104    Time  4    Period  Weeks    Status  New    Target Date  06/21/17      CC Long Term Goal  #4   Title  Pt will be independent in a home exercise program for continued strengthening and stretching    Time  4    Period  Weeks    Status  New    Target Date  06/21/17      CC Long Term Goal  #5   Title  Pt will decrease impairment score on Quick Dash to less than 20 to allow improved UE function    Baseline  65.91    Time  4    Period  Weeks    Status  New          Plan - 05/24/17 1429    Clinical Impression Statement  Pt presents to PT following bilateral mastectomy for treatment of R breast cancer on 05/03/17. She underwent a sentinel lymph node dissection with 17  nodes removed - 1 was positive. She has completed chemotherapy but will be beginning radiation soon. She now presents with decreased bilateral shoulder ROM with R more limited than left. She is right handed. She also has cording present in R axilla. Her scars are still healing and steri strips are intact. There is some minimal swelling in right lateral trunk along lateral edge of scar. Pt would benefit from skilled PT services to increase bilateral shoulder ROM and strength, decrease post op edema, decrease tightness from cording and instruct pt in a home exercise program.    History and  Personal Factors relevant to plan of care:  pt is right handed    Clinical Presentation  Evolving    Clinical Presentation due to:  pt to begin radiation soon, surgical site still healing    Rehab Potential  Good    Clinical Impairments Affecting Rehab Potential  pt will be beginning radiation soon    PT Frequency  2x / week    PT Duration  4 weeks    PT Treatment/Interventions  ADLs/Self Care Home Management;Therapeutic exercise;Therapeutic activities;Patient/family education;Orthotic Fit/Training;Manual techniques;Manual lymph drainage;Compression bandaging;Taping;Passive range of motion;Scar mobilization    PT Next Visit Plan  get rx for sleeve for R side, give supine dowel exercises, begin instruction in gentle AA/A/PROM    PT Home Exercise Plan  post op breast exercises    Consulted and Agree with Plan of Care  Patient       Patient will benefit from skilled therapeutic intervention in order to improve the following deficits and impairments:  Pain, Increased fascial restricitons, Decreased scar mobility, Postural dysfunction, Decreased range of motion, Decreased strength, Impaired UE functional use, Decreased knowledge of precautions, Increased edema  Visit Diagnosis: Stiffness of right shoulder, not elsewhere classified - Plan: PT plan of care cert/re-cert  Stiffness of left shoulder, not elsewhere classified - Plan: PT plan of care cert/re-cert  Acute pain of right shoulder - Plan: PT plan of care cert/re-cert  Muscle weakness (generalized) - Plan: PT plan of care cert/re-cert  Localized edema - Plan: PT plan of care cert/re-cert     Problem List Patient Active Problem List   Diagnosis Date  Noted  . Genetic testing 12/13/2016  . Malignant neoplasm of upper-outer quadrant of right breast in female, estrogen receptor positive ([place]) 11/23/2016  . Stress at home 02/29/2012    Allyson Sabal [facility] 05/24/2017, 5:21 PM  [place], [place], [address] Phone: [phone]   Fax:  [phone]  Name: Kimberly Maldonado MRN: [id number] Date of Birth: 1976-03-21  Manus Gunning, PT 05/24/17 5:21 PM

## 2017-05-29 ENCOUNTER — Ambulatory Visit: Payer: 59

## 2017-05-29 DIAGNOSIS — M25611 Stiffness of right shoulder, not elsewhere classified: Secondary | ICD-10-CM

## 2017-05-29 DIAGNOSIS — R600 Localized edema: Secondary | ICD-10-CM

## 2017-05-29 DIAGNOSIS — M25511 Pain in right shoulder: Secondary | ICD-10-CM

## 2017-05-29 DIAGNOSIS — M6281 Muscle weakness (generalized): Secondary | ICD-10-CM

## 2017-05-29 DIAGNOSIS — M25612 Stiffness of left shoulder, not elsewhere classified: Secondary | ICD-10-CM

## 2017-05-29 NOTE — Therapy (Addendum)
[place], [place], [address] Phone: [phone]   Fax:  [phone]  Physical Therapy Treatment  Patient Details  Name: Kimberly Maldonado MRN: [id number] Date of Birth: [birth year] Referring Provider: Dr. Elisha Headland   Encounter Date: 05/29/2017  PT End of Session - 05/29/17 0848    Visit Number  2    Number of Visits  9    Date for PT Re-Evaluation  06/21/17    PT Start Time  0802    PT Stop Time  0848    PT Time Calculation (min)  46 min    Activity Tolerance  Patient tolerated treatment well    Behavior During Therapy  [facility] for tasks assessed/performed       Past Medical History:  Diagnosis Date  . Allergy    takes Zyrtec and Singulair daily  . Anxiety   . Breast cancer ([place]) 11/15/2016   right breast  . Carpal tunnel syndrome   . Depression    takes Cymbalta daily  . Genetic testing 12/13/2016   Ms. Huxford underwent genetic counseling and testing for hereditary cancer syndromes on 11/23/2016.Testing was performed through a custom panel that combined [employer]'s Common Hereditary Cancers Panel with [employer]'s Thyroid Cancer Panel. This custom panel includes analysis of the following 48 genes: APC, ATM, AXIN2, BARD1, BMPR1A, BRCA1, BRCA2, BRIP1, CDH1, CDKN2A, CHEK2, CTNNA1, DICER1, EPCAM, GREM1  . Insomnia    takes Trazodone and Ambien nightly  . Migraines     Past Surgical History:  Procedure Laterality Date  . COLONOSCOPY    . KIDNEY DONATION Left   . RIGHT BREAST EXCISIONAL BIOPSY Right 11/15/2016   Performed by [name], MD at [facility] OR    There were no vitals filed for this visit.  Subjective Assessment - 05/29/17 0805    Subjective  My shoulders just feel really tight with some discomfort but I don't have pain.     Pertinent History  R breast cancer s/p bilateral mastectomy with SLN dissection on R (17, only 1 positive) 05/03/17, pt has completed chemotherapy and pt will begin radiation  in the next few weeks, pt donated left kidney    Patient Stated Goals  to be as close to normal as possible to get back to prior level of function    Currently in Pain?  No/denies                      [facility] Adult PT Treatment/Exercise - 05/29/17 0001      Shoulder Exercises: Supine   External Rotation  AAROM;Both;5 reps Using dowel; 5 sec holds    External Rotation Limitations  No stretch felt so did not add this to HEP.    Flexion  AAROM;Both;5 reps With dowel; 5 sec holds    ABduction  AAROM;Right;5 reps With dowel; 5 sec holds      Manual Therapy   Manual Therapy  Myofascial release;Passive ROM    Myofascial Release  To Rt axilla at area of cording    Passive ROM  In Supine to bil shoulders into flexion, abduction and er to pts tolerance             PT Education - 05/29/17 0805    Education provided  Yes    Education Details  Supine dowel stretches    Person(s) Educated  Patient    Methods  Explanation;Demonstration;Handout    Comprehension  Verbalized understanding;Returned demonstration  [facility] Goals - 05/24/17 1717      CC Long Term Goal  #1   Title  Pt will be able to independently verbalize lymphedema risk reduction practices    Time  4    Period  Weeks    Status  New    Target Date  06/21/17      CC Long Term Goal  #2   Title  Pt will demonstrate 160 degrees of right and left shoulder flexion to allow her to reach items overhead    Baseline  R 107, L 125    Time  4    Period  Weeks    Status  New    Target Date  06/21/17      CC Long Term Goal  #3   Title  Pt will demonstrate 160 degrees of bilateral shoulder abduction to allow pt to reach out to sides    Baseline  R 73, L 104    Time  4    Period  Weeks    Status  New    Target Date  06/21/17      CC Long Term Goal  #4   Title  Pt will be independent in a home exercise program for continued strengthening and stretching    Time  4    Period  Weeks     Status  New    Target Date  06/21/17      CC Long Term Goal  #5   Title  Pt will decrease impairment score on Quick Dash to less than 20 to allow improved UE function    Baseline  65.91    Time  4    Period  Weeks    Status  New         Plan - 05/29/17 0849    Clinical Impression Statement  Pt tolerate first session of stretching and AA/ROM very well reporting feeling good after session. Was initially having some pain with myofascial strtch of cording but htis decreased throughout session and pts end P/ROM improved considerably as well. Added supine dowel to HEP.    Rehab Potential  Good    Clinical Impairments Affecting Rehab Potential  pt will be beginning radiation soon    PT Frequency  2x / week    PT Duration  4 weeks    PT Treatment/Interventions  ADLs/Self Care Home Management;Therapeutic exercise;Therapeutic activities;Patient/family education;Orthotic Fit/Training;Manual techniques;Manual lymph drainage;Compression bandaging;Taping;Passive range of motion;Scar mobilization    PT Next Visit Plan  Issue script for sleeve if returned signed. Review supine dowel exercises, cont instruction in gentle AA/A/PROM    Recommended Other Services  Script sent to Dr. Elisha Headland for compression garment(s)    Consulted and Agree with Plan of Care  Patient       Patient will benefit from skilled therapeutic intervention in order to improve the following deficits and impairments:  Pain, Increased fascial restricitons, Decreased scar mobility, Postural dysfunction, Decreased range of motion, Decreased strength, Impaired UE functional use, Decreased knowledge of precautions, Increased edema  Visit Diagnosis: Stiffness of right shoulder, not elsewhere classified  Stiffness of left shoulder, not elsewhere classified  Acute pain of right shoulder  Muscle weakness (generalized)  Localized edema     Problem List Patient Active Problem List   Diagnosis Date Noted  . Genetic testing  12/13/2016  . Malignant neoplasm of upper-outer quadrant of right breast in female, estrogen receptor positive ([place]) 11/23/2016  . Stress at home 02/29/2012  Otelia Limes, PTA 05/29/2017, 9:24 AM  [place], [place], [address] Phone: [phone]   Fax:  [phone]  Name: Kimberly Maldonado MRN: [id number] Date of Birth: Jan 11, 1976

## 2017-05-29 NOTE — Patient Instructions (Addendum)
SHOULDER: Flexion - Supine (Cane)        Cancer Rehab [id number] ° ° ° °Hold cane in both hands. Raise arms up overhead. Do not allow back to arch. Hold _5__ seconds. Do __5-10__ times; __1-2__ times a day. ° ° °SELF ASSISTED WITH OBJECT: Shoulder Abduction / Adduction - Supine ° ° ° °Hold cane with both hands. Move both arms from side to side, keep elbows straight.  Hold when stretch felt for __5__ seconds. Repeat __5-10__ times; __1-2__ times a day. Once this becomes easier progress to third picture bringing affected arm towards ear by staying out to side. Same hold for _5_seconds. Repeat  _5-10_ times, _1-2_ times/day. ° °Shoulder Blade Stretch ° ° ° °Clasp fingers behind head with elbows touching in front of face. Pull elbows back while pressing shoulder blades together. Relax and hold as tolerated, can place pillow under elbow here for comfort as needed and to allow for prolonged stretch.  °Repeat __5__ times. Do __1-2__ sessions per day. ° ° ° ° ° ° °

## 2017-05-30 DIAGNOSIS — C50411 Malignant neoplasm of upper-outer quadrant of right female breast: Secondary | ICD-10-CM | POA: Diagnosis not present

## 2017-05-30 DIAGNOSIS — C773 Secondary and unspecified malignant neoplasm of axilla and upper limb lymph nodes: Secondary | ICD-10-CM | POA: Diagnosis not present

## 2017-05-30 DIAGNOSIS — Z170 Estrogen receptor positive status [ER+]: Secondary | ICD-10-CM | POA: Diagnosis not present

## 2017-06-04 DIAGNOSIS — Z170 Estrogen receptor positive status [ER+]: Secondary | ICD-10-CM | POA: Diagnosis not present

## 2017-06-04 DIAGNOSIS — D37039 Neoplasm of uncertain behavior of the major salivary glands, unspecified: Secondary | ICD-10-CM | POA: Diagnosis not present

## 2017-06-04 DIAGNOSIS — C50411 Malignant neoplasm of upper-outer quadrant of right female breast: Secondary | ICD-10-CM | POA: Diagnosis not present

## 2017-06-04 DIAGNOSIS — Z9013 Acquired absence of bilateral breasts and nipples: Secondary | ICD-10-CM | POA: Diagnosis not present

## 2017-06-06 ENCOUNTER — Ambulatory Visit: Payer: 59

## 2017-06-06 DIAGNOSIS — M25611 Stiffness of right shoulder, not elsewhere classified: Secondary | ICD-10-CM | POA: Diagnosis not present

## 2017-06-06 DIAGNOSIS — M6281 Muscle weakness (generalized): Secondary | ICD-10-CM

## 2017-06-06 DIAGNOSIS — M25612 Stiffness of left shoulder, not elsewhere classified: Secondary | ICD-10-CM

## 2017-06-06 DIAGNOSIS — C773 Secondary and unspecified malignant neoplasm of axilla and upper limb lymph nodes: Secondary | ICD-10-CM | POA: Diagnosis not present

## 2017-06-06 DIAGNOSIS — C50411 Malignant neoplasm of upper-outer quadrant of right female breast: Secondary | ICD-10-CM | POA: Diagnosis not present

## 2017-06-06 DIAGNOSIS — M25511 Pain in right shoulder: Secondary | ICD-10-CM

## 2017-06-06 DIAGNOSIS — Z853 Personal history of malignant neoplasm of breast: Secondary | ICD-10-CM | POA: Diagnosis not present

## 2017-06-06 DIAGNOSIS — R600 Localized edema: Secondary | ICD-10-CM

## 2017-06-06 NOTE — Therapy (Signed)
[place], [place], [address] Phone: [phone]   Fax:  [phone]  Physical Therapy Treatment  Patient Details  Name: Kimberly Maldonado MRN: [id number] Date of Birth: 24-Sep-1975 Referring Provider: Dr. Elisha Headland   Encounter Date: 06/06/2017  PT End of Session - 06/06/17 0933    Visit Number  3    Number of Visits  9    Date for PT Re-Evaluation  06/21/17    PT Start Time  0846    PT Stop Time  0931    PT Time Calculation (min)  45 min    Activity Tolerance  Patient tolerated treatment well    Behavior During Therapy  [facility] for tasks assessed/performed       Past Medical History:  Diagnosis Date  . Allergy    takes Zyrtec and Singulair daily  . Anxiety   . Breast cancer ([place]) 11/15/2016   right breast  . Carpal tunnel syndrome   . Depression    takes Cymbalta daily  . Genetic testing 12/13/2016   Ms. Lowder underwent genetic counseling and testing for hereditary cancer syndromes on 11/23/2016.Testing was performed through a custom panel that combined [employer]'s Common Hereditary Cancers Panel with [employer]'s Thyroid Cancer Panel. This custom panel includes analysis of the following 48 genes: APC, ATM, AXIN2, BARD1, BMPR1A, BRCA1, BRCA2, BRIP1, CDH1, CDKN2A, CHEK2, CTNNA1, DICER1, EPCAM, GREM1  . Insomnia    takes Trazodone and Ambien nightly  . Migraines     Past Surgical History:  Procedure Laterality Date  . BREAST BIOPSY Right 11/15/2016   Procedure: RIGHT BREAST EXCISIONAL BIOPSY;  Surgeon: [name], MD;  Location: [facility];  Service: General;  Laterality: Right;  . COLONOSCOPY    . KIDNEY DONATION Left     There were no vitals filed for this visit.  Subjective Assessment - 06/06/17 0848    Subjective  I felt good after last visit, I was just a little sore but it was a good sore from stretching. Stretching at least once/day.     Pertinent History  R breast cancer s/p bilateral  mastectomy with SLN dissection on R (17, only 1 positive) 05/03/17, pt has completed chemotherapy and pt will begin radiation in the next few weeks, pt donated left kidney    Patient Stated Goals  to be as close to normal as possible to get back to prior level of function    Currently in Pain?  No/denies         [facility] PT Assessment - 06/06/17 0001      AROM   Right Shoulder Flexion  126 Degrees    Right Shoulder ABduction  166 Degrees    Right Shoulder Internal Rotation  66 Degrees    Left Shoulder Flexion  129 Degrees    Left Shoulder ABduction  172 Degrees    Left Shoulder Internal Rotation  75 Degrees                  [facility] Adult PT Treatment/Exercise - 06/06/17 0001      Shoulder Exercises: Pulleys   Flexion  2 minutes    Flexion Limitations  VCs to decreases scapular compensation    ABduction  2 minutes      Shoulder Exercises: Therapy Ball   Flexion  10 reps Forward lean into end of stretch    ABduction  5 reps Bil UE's with side lean into end of stretch      Manual Therapy  Manual Therapy  Myofascial release;Passive ROM    Myofascial Release  To Rt axilla at area of cording but this was minimally palpable today    Passive ROM  In Supine to bil shoulders into flexion, abduction and er to pts tolerance                     [facility] Goals - 05/24/17 1717      CC Long Term Goal  #1   Title  Pt will be able to independently verbalize lymphedema risk reduction practices    Time  4    Period  Weeks    Status  New    Target Date  06/21/17      CC Long Term Goal  #2   Title  Pt will demonstrate 160 degrees of right and left shoulder flexion to allow her to reach items overhead    Baseline  R 107, L 125    Time  4    Period  Weeks    Status  New    Target Date  06/21/17      CC Long Term Goal  #3   Title  Pt will demonstrate 160 degrees of bilateral shoulder abduction to allow pt to reach out to sides    Baseline  R 73, L 104     Time  4    Period  Weeks    Status  New    Target Date  06/21/17      CC Long Term Goal  #4   Title  Pt will be independent in a home exercise program for continued strengthening and stretching    Time  4    Period  Weeks    Status  New    Target Date  06/21/17      CC Long Term Goal  #5   Title  Pt will decrease impairment score on Quick Dash to less than 20 to allow improved UE function    Baseline  65.91    Time  4    Period  Weeks    Status  New         Plan - 06/06/17 0934    Clinical Impression Statement  Pt has made great progress so far with her bil shoulder A/ROM having improved wel since evaluation. She reports noticing improvements as wel with her ADLs and her cording was very minimally palpable today.     Rehab Potential  Good    Clinical Impairments Affecting Rehab Potential  pt will be beginning radiation soon    PT Frequency  2x / week    PT Duration  4 weeks    PT Treatment/Interventions  ADLs/Self Care Home Management;Therapeutic exercise;Therapeutic activities;Patient/family education;Orthotic Fit/Training;Manual techniques;Manual lymph drainage;Compression bandaging;Taping;Passive range of motion;Scar mobilization    PT Next Visit Plan  Issue script for sleeve if returned signed. Cont instruction in and performing AA/A/PROM.    Consulted and Agree with Plan of Care  Patient       Patient will benefit from skilled therapeutic intervention in order to improve the following deficits and impairments:  Pain, Increased fascial restricitons, Decreased scar mobility, Postural dysfunction, Decreased range of motion, Decreased strength, Impaired UE functional use, Decreased knowledge of precautions, Increased edema  Visit Diagnosis: Stiffness of right shoulder, not elsewhere classified  Stiffness of left shoulder, not elsewhere classified  Acute pain of right shoulder  Muscle weakness (generalized)  Localized edema     Problem List Patient  Active Problem  List   Diagnosis Date Noted  . Genetic testing 12/13/2016  . Malignant neoplasm of upper-outer quadrant of right breast in female, estrogen receptor positive ([place]) 11/23/2016  . Stress at home 02/29/2012    Otelia Limes, PTA 06/06/2017, 9:36 AM  [place], [place], [address] Phone: [phone]   Fax:  [phone]  Name: Kimberly Maldonado MRN: [id number] Date of Birth: 11-13-1975

## 2017-06-07 DIAGNOSIS — C50911 Malignant neoplasm of unspecified site of right female breast: Secondary | ICD-10-CM | POA: Diagnosis not present

## 2017-06-07 DIAGNOSIS — Z9012 Acquired absence of left breast and nipple: Secondary | ICD-10-CM | POA: Diagnosis not present

## 2017-06-08 ENCOUNTER — Encounter: Payer: 59 | Admitting: Physical Therapy

## 2017-06-08 DIAGNOSIS — C50411 Malignant neoplasm of upper-outer quadrant of right female breast: Secondary | ICD-10-CM | POA: Diagnosis not present

## 2017-06-08 DIAGNOSIS — C773 Secondary and unspecified malignant neoplasm of axilla and upper limb lymph nodes: Secondary | ICD-10-CM | POA: Diagnosis not present

## 2017-06-11 DIAGNOSIS — Z510 Encounter for antineoplastic radiation therapy: Secondary | ICD-10-CM | POA: Diagnosis not present

## 2017-06-11 DIAGNOSIS — C773 Secondary and unspecified malignant neoplasm of axilla and upper limb lymph nodes: Secondary | ICD-10-CM | POA: Diagnosis not present

## 2017-06-11 DIAGNOSIS — C50411 Malignant neoplasm of upper-outer quadrant of right female breast: Secondary | ICD-10-CM | POA: Diagnosis not present

## 2017-06-18 ENCOUNTER — Encounter: Payer: 59 | Admitting: Physical Therapy

## 2017-06-19 DIAGNOSIS — C50411 Malignant neoplasm of upper-outer quadrant of right female breast: Secondary | ICD-10-CM | POA: Diagnosis not present

## 2017-06-19 DIAGNOSIS — Z510 Encounter for antineoplastic radiation therapy: Secondary | ICD-10-CM | POA: Diagnosis not present

## 2017-06-19 DIAGNOSIS — C773 Secondary and unspecified malignant neoplasm of axilla and upper limb lymph nodes: Secondary | ICD-10-CM | POA: Diagnosis not present

## 2017-06-20 ENCOUNTER — Encounter: Payer: 59 | Admitting: Physical Therapy

## 2017-06-20 DIAGNOSIS — Z170 Estrogen receptor positive status [ER+]: Secondary | ICD-10-CM | POA: Diagnosis not present

## 2017-06-20 DIAGNOSIS — C50411 Malignant neoplasm of upper-outer quadrant of right female breast: Secondary | ICD-10-CM | POA: Diagnosis not present

## 2017-06-20 DIAGNOSIS — C779 Secondary and unspecified malignant neoplasm of lymph node, unspecified: Secondary | ICD-10-CM | POA: Diagnosis not present

## 2017-06-25 ENCOUNTER — Encounter: Payer: 59 | Admitting: Physical Therapy

## 2017-06-27 ENCOUNTER — Ambulatory Visit: Payer: 59 | Attending: Surgery

## 2017-06-27 DIAGNOSIS — M25611 Stiffness of right shoulder, not elsewhere classified: Secondary | ICD-10-CM | POA: Diagnosis not present

## 2017-06-27 DIAGNOSIS — M25511 Pain in right shoulder: Secondary | ICD-10-CM | POA: Insufficient documentation

## 2017-06-27 DIAGNOSIS — M6281 Muscle weakness (generalized): Secondary | ICD-10-CM

## 2017-06-27 DIAGNOSIS — M25612 Stiffness of left shoulder, not elsewhere classified: Secondary | ICD-10-CM | POA: Insufficient documentation

## 2017-06-27 DIAGNOSIS — C50411 Malignant neoplasm of upper-outer quadrant of right female breast: Secondary | ICD-10-CM | POA: Diagnosis not present

## 2017-06-27 DIAGNOSIS — Z510 Encounter for antineoplastic radiation therapy: Secondary | ICD-10-CM | POA: Diagnosis not present

## 2017-06-27 DIAGNOSIS — C773 Secondary and unspecified malignant neoplasm of axilla and upper limb lymph nodes: Secondary | ICD-10-CM | POA: Diagnosis not present

## 2017-06-27 DIAGNOSIS — R600 Localized edema: Secondary | ICD-10-CM | POA: Insufficient documentation

## 2017-06-27 NOTE — Therapy (Signed)
[place], [place], [address] Phone: [phone]   Fax:  [phone]  Physical Therapy Treatment  Patient Details  Name: Kimberly Maldonado MRN: [id number] Date of Birth: 1975/09/27 Referring Provider: Dr. Elisha Headland   Encounter Date: 06/27/2017  PT End of Session - 06/27/17 1547    Visit Number  4    Number of Visits  9    Date for PT Re-Evaluation  07/25/17    PT Start Time  1524    PT Stop Time  1604    PT Time Calculation (min)  40 min    Activity Tolerance  Patient tolerated treatment well    Behavior During Therapy  [facility] for tasks assessed/performed       Past Medical History:  Diagnosis Date  . Allergy    takes Zyrtec and Singulair daily  . Anxiety   . Breast cancer ([place]) 11/15/2016   right breast  . Carpal tunnel syndrome   . Depression    takes Cymbalta daily  . Genetic testing 12/13/2016   Ms. Yandell underwent genetic counseling and testing for hereditary cancer syndromes on 11/23/2016.Testing was performed through a custom panel that combined [employer]'s Common Hereditary Cancers Panel with [employer]'s Thyroid Cancer Panel. This custom panel includes analysis of the following 48 genes: APC, ATM, AXIN2, BARD1, BMPR1A, BRCA1, BRCA2, BRIP1, CDH1, CDKN2A, CHEK2, CTNNA1, DICER1, EPCAM, GREM1  . Insomnia    takes Trazodone and Ambien nightly  . Migraines     Past Surgical History:  Procedure Laterality Date  . BREAST BIOPSY Right 11/15/2016   Procedure: RIGHT BREAST EXCISIONAL BIOPSY;  Surgeon: [name], MD;  Location: [facility];  Service: General;  Laterality: Right;  . COLONOSCOPY    . KIDNEY DONATION Left     There were no vitals filed for this visit.  Subjective Assessment - 06/27/17 1528    Subjective  I've been getting radiation so I had to miss some appointments because of that. My Rt shoulder has been doing okay but I can tell it's getting tighter from radiation.     Pertinent  History  R breast cancer s/p bilateral mastectomy with SLN dissection on R (17, only 1 positive) 05/03/17, pt has completed chemotherapy and pt will begin radiation in the next few weeks, pt donated left kidney    Patient Stated Goals  to be as close to normal as possible to get back to prior level of function    Currently in Pain?  No/denies         [facility] PT Assessment - 06/27/17 0001      AROM   Right Shoulder Flexion  153 Degrees    Right Shoulder ABduction  176 Degrees    Right Shoulder Internal Rotation  79 Degrees    Left Shoulder Flexion  153 Degrees    Left Shoulder ABduction  175 Degrees    Left Shoulder Internal Rotation  78 Degrees           Quick Dash - 06/27/17 0001    Open a tight or new jar  No difficulty    Do heavy household chores (wash walls, wash floors)  No difficulty    Carry a shopping bag or briefcase  Mild difficulty    Wash your back  No difficulty    Use a knife to cut food  No difficulty    Recreational activities in which you take some force or impact through your arm, shoulder, or hand (golf, hammering,  tennis)  No difficulty    During the past week, to what extent has your arm, shoulder or hand problem interfered with your normal social activities with family, friends, neighbors, or groups?  Not at all    During the past week, to what extent has your arm, shoulder or hand problem limited your work or other regular daily activities  Not at all    Arm, shoulder, or hand pain.  None    Tingling (pins and needles) in your arm, shoulder, or hand  Mild    Difficulty Sleeping  Mild difficulty    DASH Score  6.82 %            [facility] Adult PT Treatment/Exercise - 06/27/17 0001      Shoulder Exercises: Supine   Horizontal ABduction  Strengthening;Both;5 reps;Theraband    Theraband Level (Shoulder Horizontal ABduction)  Level 1 (Yellow)    External Rotation  Strengthening;Both;5 reps;Theraband    Theraband Level (Shoulder External Rotation)  Level 1  (Yellow)    Flexion  Strengthening;Both;5 reps;Theraband Narrow and Wide Grip, 5 times each    Theraband Level (Shoulder Flexion)  Level 1 (Yellow)    Other Supine Exercises  Bil D2 with yellow theraband, 5 times each with pt returning therapist demonstration of all above exercises      Shoulder Exercises: Pulleys   Flexion  2 minutes    ABduction  2 minutes      Shoulder Exercises: Therapy Ball   Flexion  10 reps With forward lean into end of stretch    ABduction  5 reps Bil UE with side lean into end of stretch      Manual Therapy   Manual Therapy  Myofascial release;Passive ROM    Myofascial Release  To Rt axilla at area of cording but this was minimally palpable today    Passive ROM  In Supine to bil shoulders into flexion, abduction and er to pts tolerance             PT Education - 06/27/17 1545    Education provided  Yes    Education Details  Supine scapular series with yellow theraband    Person(s) Educated  Patient    Methods  Explanation;Demonstration;Handout    Comprehension  Verbalized understanding;Returned demonstration             [facility] Goals - 06/27/17 1550      CC Long Term Goal  #1   Title  Pt will be able to independently verbalize lymphedema risk reduction practices    Status  Achieved      CC Long Term Goal  #2   Title  Pt will demonstrate 160 degrees of right and left shoulder flexion to allow her to reach items overhead    Baseline  R 107, L 125; 153 bil shoulders-06/27/17    Status  On-going      CC Long Term Goal  #3   Title  Pt will demonstrate 160 degrees of bilateral shoulder abduction to allow pt to reach out to sides    Baseline  R 73, L 104; Rt 176 and Lt 175 degrees-06/27/17    Status  Achieved      CC Long Term Goal  #4   Title  Pt will be independent in a home exercise program for continued strengthening and stretching    Status  Achieved      CC Long Term Goal  #5   Title  Pt will decrease impairment  score on  Quick Dash to less than 20 to allow improved UE function    Baseline  65.91; 6.82 - 06/27/17    Status  Achieved         Plan - 06/27/17 1548    Clinical Impression Statement  Pt has missed appointments last 3 weeks due to radiation and then snowy weather. Her A/ROM is nearly WNLs and she reports overall doing very well. Progressed her HEP to include supine scapular series with yellow theraband today which she tolerated well. Pt has met all goals and is ready for D/C at this time.     Rehab Potential  Good    Clinical Impairments Affecting Rehab Potential  pt will be beginning radiation soon    PT Frequency  2x / week    PT Duration  4 weeks    PT Treatment/Interventions  ADLs/Self Care Home Management;Therapeutic exercise;Therapeutic activities;Patient/family education;Orthotic Fit/Training;Manual techniques;Manual lymph drainage;Compression bandaging;Taping;Passive range of motion;Scar mobilization    PT Next Visit Plan  D/C this visit.     Recommended Other Services  Issued pt script for doctor to sign as it hasn't been faxed back yet.        Patient will benefit from skilled therapeutic intervention in order to improve the following deficits and impairments:  Pain, Increased fascial restricitons, Decreased scar mobility, Postural dysfunction, Decreased range of motion, Decreased strength, Impaired UE functional use, Decreased knowledge of precautions, Increased edema  Visit Diagnosis: Stiffness of right shoulder, not elsewhere classified  Stiffness of left shoulder, not elsewhere classified  Acute pain of right shoulder  Muscle weakness (generalized)  Localized edema     Problem List Patient Active Problem List   Diagnosis Date Noted  . Genetic testing 12/13/2016  . Malignant neoplasm of upper-outer quadrant of right breast in female, estrogen receptor positive ([place]) 11/23/2016  . Stress at home 02/29/2012    Otelia Limes, PTA 06/27/2017, 5:10 PM  [place], [place], [address] Phone: [phone]   Fax:  [phone]  Name: Kimberly Maldonado MRN: [id number] Date of Birth: 1976/02/08  PHYSICAL THERAPY DISCHARGE SUMMARY  Visits from Start of Care: 4  Current functional level related to goals / functional outcomes: Goals mostly met as noted above.   Remaining deficits: Could continue to see improvement in shoulder flexion ROM.   Education / Equipment: Home exercise program instruction. Plan: Patient agrees to discharge.  Patient goals were not met. Patient is being discharged due to meeting the stated rehab goals.  ?????    Serafina Royals, PT 06/27/17 5:24 PM

## 2017-06-27 NOTE — Patient Instructions (Signed)

## 2017-06-28 DIAGNOSIS — C50911 Malignant neoplasm of unspecified site of right female breast: Secondary | ICD-10-CM | POA: Diagnosis not present

## 2017-07-05 DIAGNOSIS — C50411 Malignant neoplasm of upper-outer quadrant of right female breast: Secondary | ICD-10-CM | POA: Diagnosis not present

## 2017-07-05 DIAGNOSIS — Z170 Estrogen receptor positive status [ER+]: Secondary | ICD-10-CM | POA: Diagnosis not present

## 2017-07-05 DIAGNOSIS — Z9013 Acquired absence of bilateral breasts and nipples: Secondary | ICD-10-CM | POA: Diagnosis not present

## 2017-07-11 DIAGNOSIS — Z510 Encounter for antineoplastic radiation therapy: Secondary | ICD-10-CM | POA: Diagnosis not present

## 2017-07-11 DIAGNOSIS — C50411 Malignant neoplasm of upper-outer quadrant of right female breast: Secondary | ICD-10-CM | POA: Diagnosis not present

## 2017-07-11 DIAGNOSIS — C773 Secondary and unspecified malignant neoplasm of axilla and upper limb lymph nodes: Secondary | ICD-10-CM | POA: Diagnosis not present

## 2017-07-16 DIAGNOSIS — C50411 Malignant neoplasm of upper-outer quadrant of right female breast: Secondary | ICD-10-CM | POA: Diagnosis not present

## 2017-07-16 DIAGNOSIS — C773 Secondary and unspecified malignant neoplasm of axilla and upper limb lymph nodes: Secondary | ICD-10-CM | POA: Diagnosis not present

## 2017-07-16 DIAGNOSIS — Z510 Encounter for antineoplastic radiation therapy: Secondary | ICD-10-CM | POA: Diagnosis not present

## 2017-07-17 DIAGNOSIS — C773 Secondary and unspecified malignant neoplasm of axilla and upper limb lymph nodes: Secondary | ICD-10-CM | POA: Diagnosis not present

## 2017-07-17 DIAGNOSIS — Z510 Encounter for antineoplastic radiation therapy: Secondary | ICD-10-CM | POA: Diagnosis not present

## 2017-07-17 DIAGNOSIS — C50411 Malignant neoplasm of upper-outer quadrant of right female breast: Secondary | ICD-10-CM | POA: Diagnosis not present

## 2017-07-18 DIAGNOSIS — D72819 Decreased white blood cell count, unspecified: Secondary | ICD-10-CM | POA: Diagnosis not present

## 2017-07-18 DIAGNOSIS — R0781 Pleurodynia: Secondary | ICD-10-CM | POA: Diagnosis not present

## 2017-07-18 DIAGNOSIS — C50411 Malignant neoplasm of upper-outer quadrant of right female breast: Secondary | ICD-10-CM | POA: Diagnosis not present

## 2017-07-18 DIAGNOSIS — D701 Agranulocytosis secondary to cancer chemotherapy: Secondary | ICD-10-CM | POA: Diagnosis not present

## 2017-07-18 DIAGNOSIS — Z170 Estrogen receptor positive status [ER+]: Secondary | ICD-10-CM | POA: Diagnosis not present

## 2017-07-20 DIAGNOSIS — D701 Agranulocytosis secondary to cancer chemotherapy: Secondary | ICD-10-CM | POA: Diagnosis not present

## 2017-07-20 DIAGNOSIS — C50411 Malignant neoplasm of upper-outer quadrant of right female breast: Secondary | ICD-10-CM | POA: Diagnosis not present

## 2017-07-20 DIAGNOSIS — D72819 Decreased white blood cell count, unspecified: Secondary | ICD-10-CM | POA: Diagnosis not present

## 2017-07-20 DIAGNOSIS — T451X5A Adverse effect of antineoplastic and immunosuppressive drugs, initial encounter: Secondary | ICD-10-CM | POA: Diagnosis not present

## 2017-07-23 DIAGNOSIS — C50419 Malignant neoplasm of upper-outer quadrant of unspecified female breast: Secondary | ICD-10-CM | POA: Diagnosis not present

## 2017-07-23 DIAGNOSIS — C50411 Malignant neoplasm of upper-outer quadrant of right female breast: Secondary | ICD-10-CM | POA: Diagnosis not present

## 2017-07-23 DIAGNOSIS — C773 Secondary and unspecified malignant neoplasm of axilla and upper limb lymph nodes: Secondary | ICD-10-CM | POA: Diagnosis not present

## 2017-07-23 DIAGNOSIS — Z510 Encounter for antineoplastic radiation therapy: Secondary | ICD-10-CM | POA: Diagnosis not present

## 2017-07-25 DIAGNOSIS — D37039 Neoplasm of uncertain behavior of the major salivary glands, unspecified: Secondary | ICD-10-CM | POA: Diagnosis not present

## 2017-08-01 DIAGNOSIS — R0781 Pleurodynia: Secondary | ICD-10-CM | POA: Diagnosis not present

## 2017-08-01 DIAGNOSIS — C50411 Malignant neoplasm of upper-outer quadrant of right female breast: Secondary | ICD-10-CM | POA: Diagnosis not present

## 2017-08-01 DIAGNOSIS — Z170 Estrogen receptor positive status [ER+]: Secondary | ICD-10-CM | POA: Diagnosis not present

## 2017-08-03 DIAGNOSIS — D72819 Decreased white blood cell count, unspecified: Secondary | ICD-10-CM | POA: Diagnosis not present

## 2017-08-03 DIAGNOSIS — C50411 Malignant neoplasm of upper-outer quadrant of right female breast: Secondary | ICD-10-CM | POA: Diagnosis not present

## 2017-08-03 DIAGNOSIS — Z170 Estrogen receptor positive status [ER+]: Secondary | ICD-10-CM | POA: Diagnosis not present

## 2017-08-15 DIAGNOSIS — Z170 Estrogen receptor positive status [ER+]: Secondary | ICD-10-CM | POA: Diagnosis not present

## 2017-08-15 DIAGNOSIS — Z5111 Encounter for antineoplastic chemotherapy: Secondary | ICD-10-CM | POA: Diagnosis not present

## 2017-08-15 DIAGNOSIS — C50411 Malignant neoplasm of upper-outer quadrant of right female breast: Secondary | ICD-10-CM | POA: Diagnosis not present

## 2017-08-22 DIAGNOSIS — D701 Agranulocytosis secondary to cancer chemotherapy: Secondary | ICD-10-CM | POA: Diagnosis not present

## 2017-08-22 DIAGNOSIS — Z170 Estrogen receptor positive status [ER+]: Secondary | ICD-10-CM | POA: Diagnosis not present

## 2017-08-22 DIAGNOSIS — C50411 Malignant neoplasm of upper-outer quadrant of right female breast: Secondary | ICD-10-CM | POA: Diagnosis not present

## 2017-08-29 DIAGNOSIS — D701 Agranulocytosis secondary to cancer chemotherapy: Secondary | ICD-10-CM | POA: Diagnosis not present

## 2017-08-29 DIAGNOSIS — T451X5A Adverse effect of antineoplastic and immunosuppressive drugs, initial encounter: Secondary | ICD-10-CM | POA: Diagnosis not present

## 2017-08-29 DIAGNOSIS — C50411 Malignant neoplasm of upper-outer quadrant of right female breast: Secondary | ICD-10-CM | POA: Diagnosis not present

## 2017-08-29 DIAGNOSIS — Z170 Estrogen receptor positive status [ER+]: Secondary | ICD-10-CM | POA: Diagnosis not present

## 2017-09-05 DIAGNOSIS — D709 Neutropenia, unspecified: Secondary | ICD-10-CM | POA: Diagnosis not present

## 2017-09-06 DIAGNOSIS — Z1211 Encounter for screening for malignant neoplasm of colon: Secondary | ICD-10-CM | POA: Diagnosis not present

## 2017-09-12 ENCOUNTER — Other Ambulatory Visit: Payer: Self-pay | Admitting: Obstetrics and Gynecology

## 2017-09-12 DIAGNOSIS — Z79811 Long term (current) use of aromatase inhibitors: Secondary | ICD-10-CM | POA: Diagnosis not present

## 2017-09-12 DIAGNOSIS — Z170 Estrogen receptor positive status [ER+]: Secondary | ICD-10-CM | POA: Diagnosis not present

## 2017-09-12 DIAGNOSIS — C50411 Malignant neoplasm of upper-outer quadrant of right female breast: Secondary | ICD-10-CM | POA: Diagnosis not present

## 2017-09-12 DIAGNOSIS — I499 Cardiac arrhythmia, unspecified: Secondary | ICD-10-CM | POA: Diagnosis not present

## 2017-09-12 DIAGNOSIS — L989 Disorder of the skin and subcutaneous tissue, unspecified: Secondary | ICD-10-CM | POA: Diagnosis not present

## 2017-09-13 DIAGNOSIS — C50411 Malignant neoplasm of upper-outer quadrant of right female breast: Secondary | ICD-10-CM | POA: Diagnosis not present

## 2017-09-13 DIAGNOSIS — L989 Disorder of the skin and subcutaneous tissue, unspecified: Secondary | ICD-10-CM | POA: Diagnosis not present

## 2017-09-13 DIAGNOSIS — D489 Neoplasm of uncertain behavior, unspecified: Secondary | ICD-10-CM | POA: Diagnosis not present

## 2017-09-13 DIAGNOSIS — L929 Granulomatous disorder of the skin and subcutaneous tissue, unspecified: Secondary | ICD-10-CM | POA: Diagnosis not present

## 2017-09-13 DIAGNOSIS — Z0000 Encounter for general adult medical examination without abnormal findings: Secondary | ICD-10-CM | POA: Diagnosis not present

## 2017-09-24 DIAGNOSIS — Z79811 Long term (current) use of aromatase inhibitors: Secondary | ICD-10-CM | POA: Diagnosis not present

## 2017-09-24 DIAGNOSIS — C50411 Malignant neoplasm of upper-outer quadrant of right female breast: Secondary | ICD-10-CM | POA: Diagnosis not present

## 2017-09-24 DIAGNOSIS — Z780 Asymptomatic menopausal state: Secondary | ICD-10-CM | POA: Diagnosis not present

## 2017-09-24 DIAGNOSIS — Z170 Estrogen receptor positive status [ER+]: Secondary | ICD-10-CM | POA: Diagnosis not present

## 2017-09-27 DIAGNOSIS — D863 Sarcoidosis of skin: Secondary | ICD-10-CM | POA: Diagnosis not present

## 2017-09-28 DIAGNOSIS — R87610 Atypical squamous cells of undetermined significance on cytologic smear of cervix (ASC-US): Secondary | ICD-10-CM | POA: Diagnosis not present

## 2017-09-28 DIAGNOSIS — Z01419 Encounter for gynecological examination (general) (routine) without abnormal findings: Secondary | ICD-10-CM | POA: Diagnosis not present

## 2017-09-28 DIAGNOSIS — Z124 Encounter for screening for malignant neoplasm of cervix: Secondary | ICD-10-CM | POA: Diagnosis not present

## 2017-10-03 DIAGNOSIS — Z923 Personal history of irradiation: Secondary | ICD-10-CM | POA: Diagnosis not present

## 2017-10-03 DIAGNOSIS — Z9013 Acquired absence of bilateral breasts and nipples: Secondary | ICD-10-CM | POA: Diagnosis not present

## 2017-10-03 DIAGNOSIS — Z853 Personal history of malignant neoplasm of breast: Secondary | ICD-10-CM | POA: Diagnosis not present

## 2017-10-08 DIAGNOSIS — J029 Acute pharyngitis, unspecified: Secondary | ICD-10-CM | POA: Diagnosis not present

## 2017-10-09 DIAGNOSIS — C50411 Malignant neoplasm of upper-outer quadrant of right female breast: Secondary | ICD-10-CM | POA: Diagnosis not present

## 2017-10-09 DIAGNOSIS — Z170 Estrogen receptor positive status [ER+]: Secondary | ICD-10-CM | POA: Diagnosis not present

## 2017-10-10 DIAGNOSIS — Z170 Estrogen receptor positive status [ER+]: Secondary | ICD-10-CM | POA: Diagnosis not present

## 2017-10-10 DIAGNOSIS — C50411 Malignant neoplasm of upper-outer quadrant of right female breast: Secondary | ICD-10-CM | POA: Diagnosis not present

## 2017-10-10 DIAGNOSIS — Z006 Encounter for examination for normal comparison and control in clinical research program: Secondary | ICD-10-CM | POA: Diagnosis not present

## 2017-10-16 DIAGNOSIS — D863 Sarcoidosis of skin: Secondary | ICD-10-CM | POA: Diagnosis not present

## 2017-10-17 DIAGNOSIS — Z170 Estrogen receptor positive status [ER+]: Secondary | ICD-10-CM | POA: Diagnosis not present

## 2017-10-17 DIAGNOSIS — C50411 Malignant neoplasm of upper-outer quadrant of right female breast: Secondary | ICD-10-CM | POA: Diagnosis not present

## 2017-10-22 DIAGNOSIS — Z170 Estrogen receptor positive status [ER+]: Secondary | ICD-10-CM | POA: Diagnosis not present

## 2017-10-22 DIAGNOSIS — C50411 Malignant neoplasm of upper-outer quadrant of right female breast: Secondary | ICD-10-CM | POA: Diagnosis not present

## 2017-10-31 DIAGNOSIS — Z170 Estrogen receptor positive status [ER+]: Secondary | ICD-10-CM | POA: Diagnosis not present

## 2017-10-31 DIAGNOSIS — C50411 Malignant neoplasm of upper-outer quadrant of right female breast: Secondary | ICD-10-CM | POA: Diagnosis not present

## 2017-10-31 DIAGNOSIS — R1011 Right upper quadrant pain: Secondary | ICD-10-CM | POA: Diagnosis not present

## 2017-11-02 DIAGNOSIS — C50411 Malignant neoplasm of upper-outer quadrant of right female breast: Secondary | ICD-10-CM | POA: Diagnosis not present

## 2017-11-02 DIAGNOSIS — R1011 Right upper quadrant pain: Secondary | ICD-10-CM | POA: Diagnosis not present

## 2017-11-02 DIAGNOSIS — Z170 Estrogen receptor positive status [ER+]: Secondary | ICD-10-CM | POA: Diagnosis not present

## 2017-11-07 DIAGNOSIS — Z170 Estrogen receptor positive status [ER+]: Secondary | ICD-10-CM | POA: Diagnosis not present

## 2017-11-07 DIAGNOSIS — Z5111 Encounter for antineoplastic chemotherapy: Secondary | ICD-10-CM | POA: Diagnosis not present

## 2017-11-07 DIAGNOSIS — C50411 Malignant neoplasm of upper-outer quadrant of right female breast: Secondary | ICD-10-CM | POA: Diagnosis not present

## 2017-11-07 DIAGNOSIS — Z006 Encounter for examination for normal comparison and control in clinical research program: Secondary | ICD-10-CM | POA: Diagnosis not present

## 2017-11-09 DIAGNOSIS — L700 Acne vulgaris: Secondary | ICD-10-CM | POA: Diagnosis not present

## 2017-11-09 DIAGNOSIS — Z79899 Other long term (current) drug therapy: Secondary | ICD-10-CM | POA: Diagnosis not present

## 2017-11-15 ENCOUNTER — Ambulatory Visit (HOSPITAL_COMMUNITY): Admit: 2017-11-15 | Payer: 59 | Admitting: Obstetrics and Gynecology

## 2017-11-15 ENCOUNTER — Encounter (HOSPITAL_COMMUNITY): Payer: Self-pay

## 2017-11-15 SURGERY — HYSTERECTOMY, TOTAL, ROBOT-ASSISTED, LAPAROSCOPIC, WITH BILATERAL SALPINGO-OOPHORECTOMY
Anesthesia: General | Laterality: Bilateral

## 2017-11-19 DIAGNOSIS — Z170 Estrogen receptor positive status [ER+]: Secondary | ICD-10-CM | POA: Diagnosis not present

## 2017-11-19 DIAGNOSIS — Z79899 Other long term (current) drug therapy: Secondary | ICD-10-CM | POA: Diagnosis not present

## 2017-11-19 DIAGNOSIS — C50411 Malignant neoplasm of upper-outer quadrant of right female breast: Secondary | ICD-10-CM | POA: Diagnosis not present

## 2017-11-30 DIAGNOSIS — J019 Acute sinusitis, unspecified: Secondary | ICD-10-CM | POA: Diagnosis not present

## 2017-11-30 DIAGNOSIS — Z5181 Encounter for therapeutic drug level monitoring: Secondary | ICD-10-CM | POA: Diagnosis not present

## 2017-11-30 DIAGNOSIS — Z79899 Other long term (current) drug therapy: Secondary | ICD-10-CM | POA: Diagnosis not present

## 2017-12-05 DIAGNOSIS — D72819 Decreased white blood cell count, unspecified: Secondary | ICD-10-CM | POA: Diagnosis not present

## 2017-12-05 DIAGNOSIS — C50411 Malignant neoplasm of upper-outer quadrant of right female breast: Secondary | ICD-10-CM | POA: Diagnosis not present

## 2017-12-05 DIAGNOSIS — Z170 Estrogen receptor positive status [ER+]: Secondary | ICD-10-CM | POA: Diagnosis not present

## 2017-12-05 DIAGNOSIS — R760 Raised antibody titer: Secondary | ICD-10-CM | POA: Diagnosis not present

## 2017-12-10 DIAGNOSIS — R5383 Other fatigue: Secondary | ICD-10-CM | POA: Diagnosis not present

## 2017-12-10 DIAGNOSIS — J0101 Acute recurrent maxillary sinusitis: Secondary | ICD-10-CM | POA: Diagnosis not present

## 2017-12-17 DIAGNOSIS — Z170 Estrogen receptor positive status [ER+]: Secondary | ICD-10-CM | POA: Diagnosis not present

## 2017-12-17 DIAGNOSIS — C50411 Malignant neoplasm of upper-outer quadrant of right female breast: Secondary | ICD-10-CM | POA: Diagnosis not present

## 2017-12-24 DIAGNOSIS — Z170 Estrogen receptor positive status [ER+]: Secondary | ICD-10-CM | POA: Diagnosis not present

## 2017-12-24 DIAGNOSIS — Z006 Encounter for examination for normal comparison and control in clinical research program: Secondary | ICD-10-CM | POA: Diagnosis not present

## 2017-12-24 DIAGNOSIS — C50411 Malignant neoplasm of upper-outer quadrant of right female breast: Secondary | ICD-10-CM | POA: Diagnosis not present

## 2018-01-02 DIAGNOSIS — Z170 Estrogen receptor positive status [ER+]: Secondary | ICD-10-CM | POA: Diagnosis not present

## 2018-01-02 DIAGNOSIS — Z5111 Encounter for antineoplastic chemotherapy: Secondary | ICD-10-CM | POA: Diagnosis not present

## 2018-01-02 DIAGNOSIS — C50411 Malignant neoplasm of upper-outer quadrant of right female breast: Secondary | ICD-10-CM | POA: Diagnosis not present

## 2018-01-14 DIAGNOSIS — Z006 Encounter for examination for normal comparison and control in clinical research program: Secondary | ICD-10-CM | POA: Diagnosis not present

## 2018-01-14 DIAGNOSIS — C50411 Malignant neoplasm of upper-outer quadrant of right female breast: Secondary | ICD-10-CM | POA: Diagnosis not present

## 2018-01-14 DIAGNOSIS — Z170 Estrogen receptor positive status [ER+]: Secondary | ICD-10-CM | POA: Diagnosis not present

## 2018-01-21 ENCOUNTER — Telehealth: Payer: Self-pay | Admitting: Oncology

## 2018-01-21 DIAGNOSIS — C50911 Malignant neoplasm of unspecified site of right female breast: Secondary | ICD-10-CM | POA: Diagnosis not present

## 2018-01-21 NOTE — Telephone Encounter (Signed)
Faxed medical records to [employer] on 01/21/18, Release ID: [id number]

## 2018-01-30 DIAGNOSIS — Z170 Estrogen receptor positive status [ER+]: Secondary | ICD-10-CM | POA: Diagnosis not present

## 2018-01-30 DIAGNOSIS — C50411 Malignant neoplasm of upper-outer quadrant of right female breast: Secondary | ICD-10-CM | POA: Diagnosis not present

## 2018-01-30 DIAGNOSIS — Z5111 Encounter for antineoplastic chemotherapy: Secondary | ICD-10-CM | POA: Diagnosis not present

## 2018-01-31 DIAGNOSIS — R0781 Pleurodynia: Secondary | ICD-10-CM | POA: Diagnosis not present

## 2018-01-31 DIAGNOSIS — Z9010 Acquired absence of unspecified breast and nipple: Secondary | ICD-10-CM | POA: Diagnosis not present

## 2018-01-31 DIAGNOSIS — R0789 Other chest pain: Secondary | ICD-10-CM | POA: Diagnosis not present

## 2018-02-01 DIAGNOSIS — C50411 Malignant neoplasm of upper-outer quadrant of right female breast: Secondary | ICD-10-CM | POA: Diagnosis not present

## 2018-02-01 DIAGNOSIS — Z170 Estrogen receptor positive status [ER+]: Secondary | ICD-10-CM | POA: Diagnosis not present

## 2018-02-05 DIAGNOSIS — D37039 Neoplasm of uncertain behavior of the major salivary glands, unspecified: Secondary | ICD-10-CM | POA: Diagnosis not present

## 2018-02-11 DIAGNOSIS — Z006 Encounter for examination for normal comparison and control in clinical research program: Secondary | ICD-10-CM | POA: Diagnosis not present

## 2018-02-11 DIAGNOSIS — Z170 Estrogen receptor positive status [ER+]: Secondary | ICD-10-CM | POA: Diagnosis not present

## 2018-02-11 DIAGNOSIS — C50411 Malignant neoplasm of upper-outer quadrant of right female breast: Secondary | ICD-10-CM | POA: Diagnosis not present

## 2018-02-25 DIAGNOSIS — Z170 Estrogen receptor positive status [ER+]: Secondary | ICD-10-CM | POA: Diagnosis not present

## 2018-02-25 DIAGNOSIS — C50411 Malignant neoplasm of upper-outer quadrant of right female breast: Secondary | ICD-10-CM | POA: Diagnosis not present

## 2018-02-25 DIAGNOSIS — Z006 Encounter for examination for normal comparison and control in clinical research program: Secondary | ICD-10-CM | POA: Diagnosis not present

## 2018-03-04 DIAGNOSIS — Z79899 Other long term (current) drug therapy: Secondary | ICD-10-CM | POA: Diagnosis not present

## 2018-03-04 DIAGNOSIS — C50411 Malignant neoplasm of upper-outer quadrant of right female breast: Secondary | ICD-10-CM | POA: Diagnosis not present

## 2018-03-04 DIAGNOSIS — C50919 Malignant neoplasm of unspecified site of unspecified female breast: Secondary | ICD-10-CM | POA: Diagnosis not present

## 2018-03-04 DIAGNOSIS — Z170 Estrogen receptor positive status [ER+]: Secondary | ICD-10-CM | POA: Diagnosis not present

## 2018-03-13 DIAGNOSIS — Z006 Encounter for examination for normal comparison and control in clinical research program: Secondary | ICD-10-CM | POA: Diagnosis not present

## 2018-03-13 DIAGNOSIS — Z170 Estrogen receptor positive status [ER+]: Secondary | ICD-10-CM | POA: Diagnosis not present

## 2018-03-13 DIAGNOSIS — C50411 Malignant neoplasm of upper-outer quadrant of right female breast: Secondary | ICD-10-CM | POA: Diagnosis not present

## 2018-03-19 DIAGNOSIS — Z170 Estrogen receptor positive status [ER+]: Secondary | ICD-10-CM | POA: Diagnosis not present

## 2018-03-19 DIAGNOSIS — C50411 Malignant neoplasm of upper-outer quadrant of right female breast: Secondary | ICD-10-CM | POA: Diagnosis not present

## 2018-03-19 DIAGNOSIS — Z452 Encounter for adjustment and management of vascular access device: Secondary | ICD-10-CM | POA: Diagnosis not present

## 2018-04-03 DIAGNOSIS — Z79811 Long term (current) use of aromatase inhibitors: Secondary | ICD-10-CM | POA: Diagnosis not present

## 2018-04-03 DIAGNOSIS — Z170 Estrogen receptor positive status [ER+]: Secondary | ICD-10-CM | POA: Diagnosis not present

## 2018-04-03 DIAGNOSIS — C50411 Malignant neoplasm of upper-outer quadrant of right female breast: Secondary | ICD-10-CM | POA: Diagnosis not present

## 2018-04-16 DIAGNOSIS — C50411 Malignant neoplasm of upper-outer quadrant of right female breast: Secondary | ICD-10-CM | POA: Diagnosis not present

## 2018-04-16 DIAGNOSIS — Z170 Estrogen receptor positive status [ER+]: Secondary | ICD-10-CM | POA: Diagnosis not present

## 2018-05-01 DIAGNOSIS — R220 Localized swelling, mass and lump, head: Secondary | ICD-10-CM | POA: Diagnosis not present

## 2018-05-01 DIAGNOSIS — Z170 Estrogen receptor positive status [ER+]: Secondary | ICD-10-CM | POA: Diagnosis not present

## 2018-05-01 DIAGNOSIS — C50411 Malignant neoplasm of upper-outer quadrant of right female breast: Secondary | ICD-10-CM | POA: Diagnosis not present

## 2018-05-10 ENCOUNTER — Other Ambulatory Visit: Payer: Self-pay | Admitting: Internal Medicine

## 2018-05-13 DIAGNOSIS — D229 Melanocytic nevi, unspecified: Secondary | ICD-10-CM | POA: Diagnosis not present

## 2018-05-13 DIAGNOSIS — L729 Follicular cyst of the skin and subcutaneous tissue, unspecified: Secondary | ICD-10-CM | POA: Diagnosis not present

## 2018-05-16 DIAGNOSIS — R309 Painful micturition, unspecified: Secondary | ICD-10-CM | POA: Diagnosis not present

## 2018-05-16 DIAGNOSIS — R102 Pelvic and perineal pain: Secondary | ICD-10-CM | POA: Diagnosis not present

## 2018-05-22 DIAGNOSIS — R102 Pelvic and perineal pain: Secondary | ICD-10-CM | POA: Diagnosis not present

## 2018-05-27 ENCOUNTER — Encounter (HOSPITAL_COMMUNITY): Payer: Self-pay

## 2018-05-27 ENCOUNTER — Inpatient Hospital Stay (HOSPITAL_COMMUNITY): Admit: 2018-05-27 | Payer: 59 | Admitting: Plastic Surgery

## 2018-05-27 SURGERY — RECONSTRUCTION, BREAST, USING LATISSIMUS DORSI MYOCUTANEOUS FLAP
Anesthesia: General | Site: Breast | Laterality: Bilateral

## 2018-05-31 DIAGNOSIS — Z79818 Long term (current) use of other agents affecting estrogen receptors and estrogen levels: Secondary | ICD-10-CM | POA: Diagnosis not present

## 2018-05-31 DIAGNOSIS — C50411 Malignant neoplasm of upper-outer quadrant of right female breast: Secondary | ICD-10-CM | POA: Diagnosis not present

## 2018-05-31 DIAGNOSIS — Z170 Estrogen receptor positive status [ER+]: Secondary | ICD-10-CM | POA: Diagnosis not present

## 2018-06-02 IMAGING — NM NM BONE WHOLE BODY
6 series · 6 of 6 positions shown · non-contrast
Comparison: Chest, abdomen and pelvis CT obtained earlier today.

CLINICAL DATA: Right breast cancer diagnosed earlier this year.
Previous left kidney donation.

EXAM:
NUCLEAR MEDICINE WHOLE BODY BONE SCAN
TECHNIQUE: Whole body anterior and posterior images were obtained approximately
3 hours after intravenous injection of radiopharmaceutical.
RADIOPHARMACEUTICALS:  20.8 mCi 4echnetium-KKm MDP IV

[Series 1: whole body · 2.66mm/px · 1 of 1 slices shown (1 of 2)]
[im 1/1]
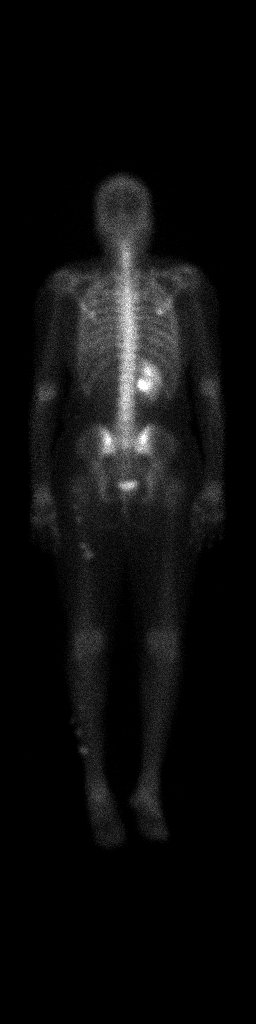

[Series 1: whole body · 2.66mm/px · 1 of 1 slices shown (2 of 2)]
[im 1/1]
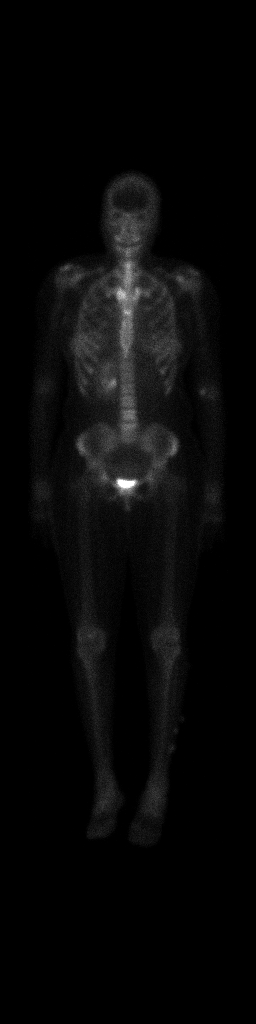

[Series 2: bone static · 2.07mm/px · 1 of 1 slices shown (1 of 2)]
[im 1/1]
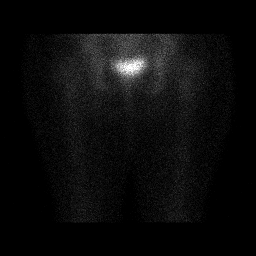

[Series 2: bone static · 2.07mm/px · 1 of 1 slices shown (2 of 2)]
[im 1/1]
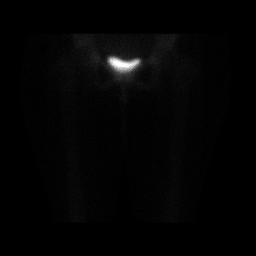

[Series 3: bone static (2) · 2.07mm/px · 1 of 1 slices shown (1 of 2)]
[im 1/1  full-range]
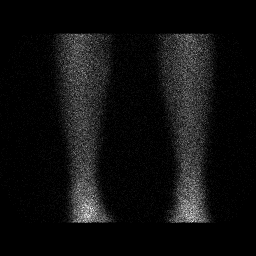

[Series 3: bone static (2) · 2.07mm/px · 1 of 1 slices shown (2 of 2)]
[im 1/1  full-range]
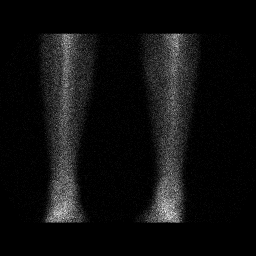

[6 of 6 positions shown; findings below may reference images not displayed]

FINDINGS: Mildly increased tracer uptake at both sternoclavicular joints,
greater on the right. Minimal associated degenerative changes on the
right on the CT images, normal appearing on the left. Mildly
increased tracer uptake at the sternomanubrial joint with mild
associated degenerative changes on the CT images. Small focus of
increased tracer activity in the left elbow, compatible with the
injection site or small amount of degenerative change. Tiny focus of
increased tracer activity in the patellar region of the right
kidney, compatible with degenerative change. Multiple small
superficial foci of increased activity in the upper and lower left
leg, compatible with urine or other contamination. Normal right
renal and bladder activity. No left renal activity seen, status post
left nephrectomy.
IMPRESSION: No evidence of bony metastatic disease.

## 2018-06-07 ENCOUNTER — Other Ambulatory Visit: Payer: Self-pay | Admitting: Internal Medicine

## 2018-06-25 DIAGNOSIS — C50411 Malignant neoplasm of upper-outer quadrant of right female breast: Secondary | ICD-10-CM | POA: Diagnosis not present

## 2018-06-25 DIAGNOSIS — Z9013 Acquired absence of bilateral breasts and nipples: Secondary | ICD-10-CM | POA: Diagnosis not present

## 2018-06-25 DIAGNOSIS — Z170 Estrogen receptor positive status [ER+]: Secondary | ICD-10-CM | POA: Diagnosis not present

## 2018-06-25 DIAGNOSIS — Z923 Personal history of irradiation: Secondary | ICD-10-CM | POA: Diagnosis not present

## 2018-06-25 DIAGNOSIS — Z08 Encounter for follow-up examination after completed treatment for malignant neoplasm: Secondary | ICD-10-CM | POA: Diagnosis not present

## 2018-07-04 ENCOUNTER — Encounter: Payer: Self-pay | Admitting: Emergency Medicine

## 2018-07-04 DIAGNOSIS — G4700 Insomnia, unspecified: Secondary | ICD-10-CM | POA: Insufficient documentation

## 2018-07-04 DIAGNOSIS — F411 Generalized anxiety disorder: Secondary | ICD-10-CM

## 2018-07-04 DIAGNOSIS — F5104 Psychophysiologic insomnia: Secondary | ICD-10-CM | POA: Insufficient documentation

## 2018-07-11 ENCOUNTER — Other Ambulatory Visit: Payer: Self-pay | Admitting: Psychiatry

## 2018-07-12 NOTE — Telephone Encounter (Signed)
Last fill 12/02 #30 Has ov 1/14  Do you need her paper chart ?

## 2018-07-15 DIAGNOSIS — Z79811 Long term (current) use of aromatase inhibitors: Secondary | ICD-10-CM | POA: Diagnosis not present

## 2018-07-15 DIAGNOSIS — Z170 Estrogen receptor positive status [ER+]: Secondary | ICD-10-CM | POA: Diagnosis not present

## 2018-07-15 DIAGNOSIS — C50411 Malignant neoplasm of upper-outer quadrant of right female breast: Secondary | ICD-10-CM | POA: Diagnosis not present

## 2018-07-23 ENCOUNTER — Ambulatory Visit: Payer: Self-pay | Admitting: Psychiatry

## 2018-07-24 ENCOUNTER — Ambulatory Visit (INDEPENDENT_AMBULATORY_CARE_PROVIDER_SITE_OTHER): Payer: 59 | Admitting: Psychiatry

## 2018-07-24 ENCOUNTER — Encounter: Payer: Self-pay | Admitting: Psychiatry

## 2018-07-24 DIAGNOSIS — F331 Major depressive disorder, recurrent, moderate: Secondary | ICD-10-CM | POA: Diagnosis not present

## 2018-07-24 DIAGNOSIS — F411 Generalized anxiety disorder: Secondary | ICD-10-CM

## 2018-07-24 DIAGNOSIS — F5105 Insomnia due to other mental disorder: Secondary | ICD-10-CM | POA: Diagnosis not present

## 2018-07-24 NOTE — Progress Notes (Signed)
Kimberly Maldonado [id number] 1975/07/22 43 y.o.  Subjective:   Patient ID:  Kimberly Maldonado is a 43 y.o. (DOB 12/28/1975) female.  Chief Complaint:  Chief Complaint  Patient presents with  . Follow-up    Medication Management    HPI  Last seen July. [name] presents to the office today for follow-up of depression and anxiety.  Doing great re: mood.  Patient reports stable mood and denies depressed or irritable moods.  Patient denies any recent difficulty with anxiety.  Patient denies difficulty with sleep initiation or maintenance except stays up too late.. Denies appetite disturbance.  Patient reports that energy and motivation have been good overall except DT chemo.  Patient denies any difficulty with concentration.  Patient denies any suicidal ideation. Less hot flashes with Effexor.  Breast CA chemo study has 1 year left.  Previous psychiatric medication trials include Lexapro, Viibryd, temazepam, Ambien CR, trazodone, Wellbutrin, duloxetine  Review of Systems:  Review of Systems  Constitutional: Positive for fatigue.  Neurological: Negative for tremors and weakness.  Psychiatric/Behavioral: Negative for agitation, behavioral problems, confusion, decreased concentration, dysphoric mood, hallucinations, self-injury, sleep disturbance and suicidal ideas. The patient is not nervous/anxious and is not hyperactive.     Medications: I have reviewed the patient's current medications.  Current Outpatient Medications  Medication Sig Dispense Refill  . abemaciclib (VERZENIO) 50 MG tablet Take 50 mg by mouth 2 (two) times daily.    . cetirizine (ZYRTEC) 10 MG tablet Take 10 mg by mouth daily as needed for allergies.    Marland Kitchen exemestane (AROMASIN) 25 MG tablet exemestane 25 mg tablet    . folic acid (FOLVITE) 1 MG tablet Take 1 mg by mouth daily.    Marland Kitchen goserelin (ZOLADEX) 10.8 MG injection Inject 10.8 mg into the skin once.    Marland Kitchen ibuprofen (ADVIL,MOTRIN) 600 MG tablet Take 1 tablet (600 mg  total) by mouth every 6 (six) hours as needed. 40 tablet 1  . montelukast (SINGULAIR) 10 MG tablet Take 10 mg by mouth daily as needed.    . pseudoephedrine-guaifenesin (MUCINEX D) 60-600 MG 12 hr tablet Take 1 tablet by mouth 2 (two) times daily as needed for congestion.    . traZODone (DESYREL) 50 MG tablet Take 50 mg by mouth at bedtime.    . tretinoin (RETIN-A) 0.1 % cream tretinoin 0.1 % topical cream    . valACYclovir (VALTREX) 500 MG tablet TAKE 1 TABLET BY ORAL ROUTE EVERY DAY AS NEEDED 30 tablet 0  . venlafaxine (EFFEXOR) 37.5 MG tablet Take 37.5 mg by mouth 3 (three) times daily with meals.     . vitamin B-12 (CYANOCOBALAMIN) 1000 MCG tablet Take 2,000 mcg by mouth daily.    Marland Kitchen zolpidem (AMBIEN) 5 MG tablet TAKE 1/2 - 1 TABLET BY MOUTH AT BEDTIME AS NEEDED 30 tablet 5  . etonogestrel-ethinyl estradiol (NUVARING) 0.12-0.015 MG/24HR vaginal ring Insert vaginally and leave in place for 3 consecutive weeks, then remove for 1 week. 1 each 11  . loratadine (CLARITIN) 10 MG tablet Take 10 mg daily by mouth.    . Multiple Vitamins-Minerals (HAIR/SKIN/NAILS/BIOTIN PO) Take 2 each by mouth daily.    Marland Kitchen oxyCODONE (OXY IR/ROXICODONE) 5 MG immediate release tablet Take 1-2 tablets (5-10 mg total) by mouth every 6 (six) hours as needed for moderate pain, severe pain or breakthrough pain. (Patient not taking: Reported on 05/24/2017) 15 tablet 0   No current facility-administered medications for this visit.     Medication Side Effects: None  Allergies: No  Known Allergies  Past Medical History:  Diagnosis Date  . Allergy    takes Zyrtec and Singulair daily  . Anxiety   . Breast cancer ([place]) 11/15/2016   right breast  . Carpal tunnel syndrome   . Depression    takes Cymbalta daily  . Genetic testing 12/13/2016   Ms. Eklund underwent genetic counseling and testing for hereditary cancer syndromes on 11/23/2016.Testing was performed through a custom panel that combined [employer]'s Common Hereditary  Cancers Panel with [employer]'s Thyroid Cancer Panel. This custom panel includes analysis of the following 48 genes: APC, ATM, AXIN2, BARD1, BMPR1A, BRCA1, BRCA2, BRIP1, CDH1, CDKN2A, CHEK2, CTNNA1, DICER1, EPCAM, GREM1  . Insomnia    takes Trazodone and Ambien nightly  . Migraines     Family History  Problem Relation Age of Onset  . COPD Maternal Grandmother   . Hypertension Maternal Grandfather   . Prostate cancer Maternal Grandfather 47  . Bone cancer Maternal Grandfather 57  . Diabetes Mother   . Breast cancer Maternal Aunt 42  . Thyroid cancer Other 40       d.64s    Social History   Socioeconomic History  . Marital status: Divorced    Spouse name: Not on file  . Number of children: Not on file  . Years of education: Not on file  . Highest education level: Not on file  Occupational History  . Not on file  Social Needs  . Financial resource strain: Not on file  . Food insecurity:    Worry: Not on file    Inability: Not on file  . Transportation needs:    Medical: Not on file    Non-medical: Not on file  Tobacco Use  . Smoking status: Never Smoker  . Smokeless tobacco: Never Used  . Tobacco comment: quit smoking 21 yrs ago  Substance and Sexual Activity  . Alcohol use: Yes  . Drug use: No  . Sexual activity: Yes    Birth control/protection: Inserts    Comment: nuva ring  Lifestyle  . Physical activity:    Days per week: Not on file    Minutes per session: Not on file  . Stress: Not on file  Relationships  . Social connections:    Talks on phone: Not on file    Gets together: Not on file    Attends religious service: Not on file    Active member of club or organization: Not on file    Attends meetings of clubs or organizations: Not on file    Relationship status: Not on file  . Intimate partner violence:    Fear of current or ex partner: Not on file    Emotionally abused: Not on file    Physically abused: Not on file    Forced sexual activity: Not on file   Other Topics Concern  . Not on file  Social History Narrative  . Not on file    Past Medical History, Surgical history, Social history, and Family history were reviewed and updated as appropriate.   Please see review of systems for further details on the patient's review from today.   Objective:   Physical Exam:  There were no vitals taken for this visit.  Physical Exam Constitutional:      General: She is not in acute distress.    Appearance: She is well-developed.  Musculoskeletal:        General: No deformity.  Neurological:     Mental Status: She is alert and  oriented to person, place, and time.     Motor: No tremor.     Coordination: Coordination normal.     Gait: Gait normal.  Psychiatric:        Attention and Perception: Attention and perception normal.        Mood and Affect: Mood is not anxious or depressed. Affect is not labile, blunt, angry or inappropriate.        Speech: Speech normal.        Behavior: Behavior normal.        Thought Content: Thought content normal. Thought content does not include homicidal or suicidal ideation. Thought content does not include homicidal or suicidal plan.        Cognition and Memory: Cognition normal.        Judgment: Judgment normal.     Comments: Insight intact. No auditory or visual hallucinations. No delusions.      Lab Review:     Component Value Date/Time   NA 139 11/23/2016 1530   K 4.0 11/23/2016 1530   CL 108 11/08/2016 0923   CO2 26 11/23/2016 1530   GLUCOSE 85 11/23/2016 1530   BUN 12.8 11/23/2016 1530   CREATININE 0.9 11/23/2016 1530   CALCIUM 9.3 11/23/2016 1530   PROT 7.2 11/23/2016 1530   ALBUMIN 3.9 11/23/2016 1530   AST 17 11/23/2016 1530   ALT 22 11/23/2016 1530   ALKPHOS 41 11/23/2016 1530   BILITOT 0.31 11/23/2016 1530   GFRNONAA >60 11/08/2016 0923   GFRAA >60 11/08/2016 0923       Component Value Date/Time   WBC 5.1 11/23/2016 1530   WBC 4.4 11/08/2016 0923   RBC 4.25 11/23/2016  1530   RBC 4.23 11/08/2016 0923   HGB 12.9 11/23/2016 1530   HCT 39.0 11/23/2016 1530   PLT 219 11/23/2016 1530   MCV 91.7 11/23/2016 1530   MCH 30.2 11/23/2016 1530   MCH 29.8 11/08/2016 0923   MCHC 33.0 11/23/2016 1530   MCHC 33.2 11/08/2016 0923   RDW 14.0 11/23/2016 1530   LYMPHSABS 2.0 11/23/2016 1530   MONOABS 0.7 11/23/2016 1530   EOSABS 0.1 11/23/2016 1530   BASOSABS 0.0 11/23/2016 1530    No results found for: POCLITH, LITHIUM   No results found for: PHENYTOIN, PHENOBARB, VALPROATE, CBMZ   .res Assessment: Plan:    Generalized anxiety disorder  Major depressive disorder, recurrent episode, moderate (HCC)  Insomnia due to mental condition   Venetia Maxon has had a good response to Effexor controlling anxiety and depression and trazodone and Ambien are required for her insomnia but that is also working well without side effects.  The Effexor is helping to suppress hot flashes associated with chemo.  Physically she is doing well with just a little bit of fatigue from the chemo.  Cancer is under control at this time.  Supportive therapy on self-care associated with the stress of having cancer and also to help keep depression and anxiety under control.  Call us if she has any relapse symptoms.  Because she has been stable a while she wishes to extend her appointment so we will follow-up in 9 months.  No med changes indicated  Follow-up 9 months  Hiram Comber, MD, DFAPA  Please see After Visit Summary for patient specific instructions.  Future Appointments  Date Time Provider [place]  10/23/2018  9:45 AM Glendale Chard, MD [facility] None    No orders of the defined types were placed in this encounter.     -------------------------------

## 2018-07-26 DIAGNOSIS — C50911 Malignant neoplasm of unspecified site of right female breast: Secondary | ICD-10-CM | POA: Diagnosis not present

## 2018-07-30 DIAGNOSIS — Z79890 Hormone replacement therapy (postmenopausal): Secondary | ICD-10-CM | POA: Diagnosis not present

## 2018-07-30 DIAGNOSIS — C50411 Malignant neoplasm of upper-outer quadrant of right female breast: Secondary | ICD-10-CM | POA: Diagnosis not present

## 2018-07-30 DIAGNOSIS — Z170 Estrogen receptor positive status [ER+]: Secondary | ICD-10-CM | POA: Diagnosis not present

## 2018-08-14 ENCOUNTER — Other Ambulatory Visit: Payer: Self-pay

## 2018-08-14 ENCOUNTER — Ambulatory Visit: Payer: 59 | Attending: Radiology | Admitting: Physical Therapy

## 2018-08-14 DIAGNOSIS — R293 Abnormal posture: Secondary | ICD-10-CM | POA: Insufficient documentation

## 2018-08-14 DIAGNOSIS — L599 Disorder of the skin and subcutaneous tissue related to radiation, unspecified: Secondary | ICD-10-CM | POA: Diagnosis not present

## 2018-08-14 DIAGNOSIS — I972 Postmastectomy lymphedema syndrome: Secondary | ICD-10-CM | POA: Insufficient documentation

## 2018-08-14 DIAGNOSIS — M25612 Stiffness of left shoulder, not elsewhere classified: Secondary | ICD-10-CM | POA: Diagnosis present

## 2018-08-14 DIAGNOSIS — M25611 Stiffness of right shoulder, not elsewhere classified: Secondary | ICD-10-CM | POA: Diagnosis present

## 2018-08-14 DIAGNOSIS — M6281 Muscle weakness (generalized): Secondary | ICD-10-CM | POA: Insufficient documentation

## 2018-08-14 DIAGNOSIS — R600 Localized edema: Secondary | ICD-10-CM | POA: Insufficient documentation

## 2018-08-14 DIAGNOSIS — M25511 Pain in right shoulder: Secondary | ICD-10-CM | POA: Insufficient documentation

## 2018-08-14 NOTE — Therapy (Signed)
[place], [place], [address] Phone: [phone]   Fax:  [phone]  Physical Therapy Evaluation  Patient Details  Name: Kimberly Maldonado MRN: [id number] Date of Birth: 18-Jan-1976 Referring Provider (PT): Amy Ovid Curd   Encounter Date: 08/14/2018  PT End of Session - 08/14/18 1456    Visit Number  1    Number of Visits  9    Date for PT Re-Evaluation  09/12/18    PT Start Time  0867    PT Stop Time  1430    PT Time Calculation (min)  45 min    Activity Tolerance  Patient tolerated treatment well    Behavior During Therapy  [facility] for tasks assessed/performed       Past Medical History:  Diagnosis Date  . Allergy    takes Zyrtec and Singulair daily  . Anxiety   . Breast cancer ([place]) 11/15/2016   right breast  . Carpal tunnel syndrome   . Depression    takes Cymbalta daily  . Genetic testing 12/13/2016   Ms. Oncale underwent genetic counseling and testing for hereditary cancer syndromes on 11/23/2016.Testing was performed through a custom panel that combined [employer]'s Common Hereditary Cancers Panel with [employer]'s Thyroid Cancer Panel. This custom panel includes analysis of the following 48 genes: APC, ATM, AXIN2, BARD1, BMPR1A, BRCA1, BRCA2, BRIP1, CDH1, CDKN2A, CHEK2, CTNNA1, DICER1, EPCAM, GREM1  . Insomnia    takes Trazodone and Ambien nightly  . Migraines     Past Surgical History:  Procedure Laterality Date  . BREAST BIOPSY Right 11/15/2016   Procedure: RIGHT BREAST EXCISIONAL BIOPSY;  Surgeon: [name], MD;  Location: [facility];  Service: General;  Laterality: Right;  . COLONOSCOPY    . KIDNEY DONATION Left     There were no vitals filed for this visit.       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   No    Is the patient reluctant to leave their home because of a fear of falling?   No      Home Environment   Living Environment  Private residence    Living Arrangements  Children    Available Help at Discharge  Family;Friend(s)    Type of [place] Access  Level entry    [place]  Two level    Alternate Level Stairs-Number of Steps  13    Alternate Level Stairs-Rails  Left    Home Equipment  None      Prior Function   Level of Independence  Independent    Vocation  Full time employment;Part time employment    Vocation Requirements  IT sales, sitting in front of computer, light travel    Leisure  strenght and conditioning.  Wants to return to lifting       Cognition   Overall Cognitive Status  Within Functional Limits for tasks assessed      Observation/Other Assessments   Observations  darkened skin at radiation field on right chest.  Pt has well healed scars across chest bilaterally, but right lateral chest area is congested with lymphostatic fibrosis.     Skin Integrity  no open areas     Other Surveys   Other Surveys    Quick DASH   13.64      Observation/Other Assessments-Edema    Edema  --   yes in right upper quadrant and right hand      Sensation   Additional Comments  partial numbness in right axillary area       Coordination   Gross Motor Movements are Fluid and Coordinated  Yes      Posture/Postural Control   Posture/Postural Control  Postural limitations    Postural Limitations  Rounded Shoulders;Forward head      ROM / Strength   AROM / PROM / Strength  AROM      AROM   Overall AROM   Within functional limits for tasks performed     Right Shoulder Flexion  --    Right Shoulder ABduction  --    Right Shoulder Internal Rotation  --    Right Shoulder External Rotation  --    Left Shoulder Flexion  --    Left Shoulder ABduction  --    Left Shoulder Internal Rotation  --    Left Shoulder External Rotation  90 Degrees      Palpation   Palpation comment  firmness and decreased scar mobility in right lateral chest         LYMPHEDEMA/ONCOLOGY QUESTIONNAIRE - 08/14/18 1409      Right Upper Extremity Lymphedema   15 cm Proximal to Olecranon Process  31.5 cm    Olecranon Process  26 cm    15 cm Proximal to Ulnar Styloid Process  25 cm    Just Proximal to Ulnar Styloid Process  17 cm    Across Hand at [employer]  20.7 cm    At [place] of 2nd Digit  6.1 cm      Left Upper Extremity Lymphedema   15 cm Proximal to Olecranon Process  32 cm    Olecranon Process  26.6 cm    15 cm Proximal to Ulnar Styloid Process  24.5 cm    Just Proximal to Ulnar Styloid Process  16.2 cm    Across Hand at [employer]  19.5 cm    At [place] of 2nd Digit  5.8 cm          Quick Dash - 08/14/18 0001    Open a tight or new jar  Mild difficulty    Do heavy household chores (wash walls, wash floors)  No difficulty    Carry a shopping bag or briefcase  Mild difficulty    Wash your back  No difficulty    Use a knife to cut food  Mild difficulty    Recreational activities in which you take some force or impact through your arm, shoulder, or hand (golf, hammering, tennis)  No difficulty    During the past week, to what extent has your arm, shoulder or hand problem interfered with your normal social activities with family, friends, neighbors, or groups?  Slightly    During the past week, to what extent  has your arm, shoulder or hand problem limited your work or other regular daily activities  Not at all    Arm, shoulder, or hand pain.  Mild    Tingling (pins and needles) in your arm, shoulder, or hand  Mild    Difficulty Sleeping  No  difficulty    DASH Score  13.64 %        Objective measurements completed on examination: See above findings.                   PT Long Term Goals - 08/14/18 1505      PT LONG TERM GOAL #1   Title  Pt will have decrease in circumference of right hand by .5 cm     Time  4    Period  Weeks    Status  New      PT LONG TERM GOAL #2   Title  Pt will be independent in self MLD and compression wrapping to right hand    Time  4    Period  Weeks    Status  New      PT LONG TERM GOAL #3   Title  Pt will be knowledgable of how to get flat knit compression daytime garments, night garments and compression pump     Time  4    Period  Weeks    Status  New             Plan - 08/14/18 1457    Clinical Impression Statement  Pt developed swelling and pain in her right arm and especially hand a few weeks ago that got better, but she still has increased circumference in her right hand compared to left.  She has been wearing her circular knit garments at home and doing ROM exercises. but the fullness in persistent  She has congestion in her right lateral chest with decreased mastectomy scar mobility and still has darkening of skin from radiation completed about a year ago. She has 17 lymph nodes removed from her right axilla and is beginning to so signs of lymphedema so she will need aggressive intervention at this time to keep symptoms from progressing and teach her to manage flareups at home. Recommend flat knit sleeve and glove, nighttime full hand and forearm/or upper arm [place] night and Flexitouch compression pump as she has congestion in lateral chest . Pt agreed to have demographics sent for assessment of insurance coverage     History and Personal Factors relevant to plan of care:  previous chemo and radiation , 17 nodes removed     Clinical Presentation  Evolving    Clinical Presentation due to:  fluctuation of lymphedema symptoms, pt increasing weight lifiting at the  gym     Clinical Decision Making  Moderate    Rehab Potential  Good    Clinical Impairments Affecting Rehab Potential  17 nodes removed and radiation     PT Frequency  2x / week    PT Duration  4 weeks    PT Treatment/Interventions  ADLs/Self Care Home Management;Therapeutic exercise;Patient/family education;Orthotic Fit/Training;Manual techniques;Manual lymph drainage;Compression bandaging;Taping;Passive range of motion;Scar mobilization    PT Next Visit Plan  Manual lymph drainage, scar mobilizaion and myofascial release to right chest.  compression wrapping to right hand and teach pt how to do it so she can be reduced for potential measuring for garments on Monday        Patient will benefit from skilled therapeutic intervention in  order to improve the following deficits and impairments:  Pain, Increased fascial restricitons, Decreased scar mobility, Postural dysfunction, Increased edema  Visit Diagnosis: Postmastectomy lymphedema syndrome - Plan: PT plan of care cert/re-cert  Disorder of the skin and subcutaneous tissue related to radiation, unspecified - Plan: PT plan of care cert/re-cert  Abnormal posture - Plan: PT plan of care cert/re-cert     Problem List Patient Active Problem List   Diagnosis Date Noted  . GAD (generalized anxiety disorder) 07/04/2018  . Insomnia 07/04/2018  . Genetic testing 12/13/2016  . Malignant neoplasm of upper-outer quadrant of right breast in female, estrogen receptor positive ([place]) 11/23/2016  . Stress at home 02/29/2012   Donato Heinz. Owens Shark PT  Norwood Levo 08/14/2018, 3:12 PM  [place], [place], [address] Phone: [phone]   Fax:  [phone]  Name: Lonnetta Kniskern MRN: [id number] Date of Birth: November 04, 1975

## 2018-08-19 ENCOUNTER — Ambulatory Visit: Payer: 59 | Admitting: Rehabilitation

## 2018-08-19 ENCOUNTER — Encounter: Payer: Self-pay | Admitting: Rehabilitation

## 2018-08-19 DIAGNOSIS — M6281 Muscle weakness (generalized): Secondary | ICD-10-CM

## 2018-08-19 DIAGNOSIS — M25511 Pain in right shoulder: Secondary | ICD-10-CM

## 2018-08-19 DIAGNOSIS — M25611 Stiffness of right shoulder, not elsewhere classified: Secondary | ICD-10-CM

## 2018-08-19 DIAGNOSIS — I972 Postmastectomy lymphedema syndrome: Secondary | ICD-10-CM

## 2018-08-19 DIAGNOSIS — M25612 Stiffness of left shoulder, not elsewhere classified: Secondary | ICD-10-CM

## 2018-08-19 DIAGNOSIS — R293 Abnormal posture: Secondary | ICD-10-CM

## 2018-08-19 DIAGNOSIS — L599 Disorder of the skin and subcutaneous tissue related to radiation, unspecified: Secondary | ICD-10-CM

## 2018-08-19 DIAGNOSIS — R600 Localized edema: Secondary | ICD-10-CM

## 2018-08-19 NOTE — Therapy (Signed)
[place], [place], [address] Phone: [phone]   Fax:  [phone]  Physical Therapy Treatment  Patient Details  Name: Kimberly Maldonado MRN: [id number] Date of Birth: 27-Jun-1976 Referring Provider (PT): Amy Ovid Curd   Encounter Date: 08/19/2018  PT End of Session - 08/19/18 1102    Visit Number  2    Number of Visits  9    Date for PT Re-Evaluation  09/12/18    PT Start Time  1100    PT Stop Time  1142    PT Time Calculation (min)  42 min    Activity Tolerance  Patient tolerated treatment well    Behavior During Therapy  [facility] for tasks assessed/performed       Past Medical History:  Diagnosis Date  . Allergy    takes Zyrtec and Singulair daily  . Anxiety   . Breast cancer ([place]) 11/15/2016   right breast  . Carpal tunnel syndrome   . Depression    takes Cymbalta daily  . Genetic testing 12/13/2016   Ms. Lean underwent genetic counseling and testing for hereditary cancer syndromes on 11/23/2016.Testing was performed through a custom panel that combined [employer]'s Common Hereditary Cancers Panel with [employer]'s Thyroid Cancer Panel. This custom panel includes analysis of the following 48 genes: APC, ATM, AXIN2, BARD1, BMPR1A, BRCA1, BRCA2, BRIP1, CDH1, CDKN2A, CHEK2, CTNNA1, DICER1, EPCAM, GREM1  . Insomnia    takes Trazodone and Ambien nightly  . Migraines     Past Surgical History:  Procedure Laterality Date  . BREAST BIOPSY Right 11/15/2016   Procedure: RIGHT BREAST EXCISIONAL BIOPSY;  Surgeon: [name], MD;  Location: [place];  Service: General;  Laterality: Right;  . COLONOSCOPY    . KIDNEY DONATION Left     There were no vitals filed for this visit.  Subjective Assessment - 08/19/18 1100    Subjective  The pump company is coming today at 2pm.  I will wait until 3/2 to get a sleeve and glove.      Currently in Pain?  No/denies                       [facility] Adult PT  Treatment/Exercise - 08/19/18 0001      Manual Therapy   Manual Therapy  Manual Lymphatic Drainage (MLD);Myofascial release;Soft tissue mobilization;Passive ROM    Soft tissue mobilization  scar mobiization using biotone    Myofascial Release  to chest wall with flexion ROM    Manual Lymphatic Drainage (MLD)  education about pathways and anatomy of lymph system then utilized Lt nodes interaxillary pathway, Rt inguinal nodes and axilloinguinal pathway.  stationary circles at chest and incision area as well as sidelying work posterior interaxillary pathway    Passive ROM  to the Rt shoulder to tolerance flex, abd, ER, and horizontal abduction                  PT Long Term Goals - 08/14/18 1505      PT LONG TERM GOAL #1   Title  Pt will have decrease in circumference of right hand by .5 cm     Time  4    Period  Weeks    Status  New      PT LONG TERM GOAL #2   Title  Pt will be independent in self MLD and compression wrapping to right hand    Time  4    Period  Weeks  Status  New      PT LONG TERM GOAL #3   Title  Pt will be knowledgable of how to get flat knit compression daytime garments, night garments and compression pump     Time  4    Period  Weeks    Status  New            Plan - 08/19/18 1524    Clinical Impression Statement  Began MLD today to the Rt chest wall to decongest the trunk and improve Rt UE flow.  mild fibrosis evident near the lateral incision and some edema near more anterior chest wall presenting as puffiness.  ROM without large restrictions but with some pulling into the chest wall.  Pt feeling much improved post treatment.      PT Treatment/Interventions  ADLs/Self Care Home Management;Therapeutic exercise;Patient/family education;Orthotic Fit/Training;Manual techniques;Manual lymph drainage;Compression bandaging;Taping;Passive range of motion;Scar mobilization    PT Next Visit Plan  How was flexitouch demo? Manual lymph drainage, scar  mobilizaion and myofascial release to right chest.  compression wrapping to right hand? vs checking the glove that the pt currently has.     Recommended Other Services  pt would like to get compression from [facility] on 3/2 aware that she could get it sooner at a special place.  Has flexitouch trial 2/10.        Patient will benefit from skilled therapeutic intervention in order to improve the following deficits and impairments:     Visit Diagnosis: Postmastectomy lymphedema syndrome  Disorder of the skin and subcutaneous tissue related to radiation, unspecified  Abnormal posture  Stiffness of right shoulder, not elsewhere classified  Stiffness of left shoulder, not elsewhere classified  Acute pain of right shoulder  Muscle weakness (generalized)  Localized edema     Problem List Patient Active Problem List   Diagnosis Date Noted  . GAD (generalized anxiety disorder) 07/04/2018  . Insomnia 07/04/2018  . Genetic testing 12/13/2016  . Malignant neoplasm of upper-outer quadrant of right breast in female, estrogen receptor positive ([place]) 11/23/2016  . Stress at home 02/29/2012    Shan Levans, PT 08/19/2018, 3:28 PM  [place], [place], [address] Phone: [phone]   Fax:  [phone]  Name: Sebrina Kessner MRN: [id number] Date of Birth: 19-Jan-1976

## 2018-08-22 DIAGNOSIS — I890 Lymphedema, not elsewhere classified: Secondary | ICD-10-CM | POA: Diagnosis not present

## 2018-08-23 ENCOUNTER — Encounter: Payer: 59 | Admitting: Physical Therapy

## 2018-08-26 DIAGNOSIS — C50411 Malignant neoplasm of upper-outer quadrant of right female breast: Secondary | ICD-10-CM | POA: Diagnosis not present

## 2018-08-26 DIAGNOSIS — Z5111 Encounter for antineoplastic chemotherapy: Secondary | ICD-10-CM | POA: Diagnosis not present

## 2018-08-26 DIAGNOSIS — Z170 Estrogen receptor positive status [ER+]: Secondary | ICD-10-CM | POA: Diagnosis not present

## 2018-08-27 ENCOUNTER — Ambulatory Visit: Payer: 59

## 2018-08-27 DIAGNOSIS — M6281 Muscle weakness (generalized): Secondary | ICD-10-CM

## 2018-08-27 DIAGNOSIS — I972 Postmastectomy lymphedema syndrome: Secondary | ICD-10-CM

## 2018-08-27 DIAGNOSIS — M25511 Pain in right shoulder: Secondary | ICD-10-CM

## 2018-08-27 DIAGNOSIS — M25612 Stiffness of left shoulder, not elsewhere classified: Secondary | ICD-10-CM

## 2018-08-27 DIAGNOSIS — L599 Disorder of the skin and subcutaneous tissue related to radiation, unspecified: Secondary | ICD-10-CM

## 2018-08-27 DIAGNOSIS — M25611 Stiffness of right shoulder, not elsewhere classified: Secondary | ICD-10-CM

## 2018-08-27 DIAGNOSIS — R293 Abnormal posture: Secondary | ICD-10-CM

## 2018-08-27 NOTE — Therapy (Signed)
[place], [place], [address] Phone: [phone]   Fax:  [phone]  Physical Therapy Treatment  Patient Details  Name: Kimberly Maldonado MRN: [id number] Date of Birth: 12/21/1975 Referring Provider (PT): Amy Ovid Curd   Encounter Date: 08/27/2018  PT End of Session - 08/27/18 1036    Visit Number  3    Number of Visits  9    Date for PT Re-Evaluation  09/12/18    PT Start Time  0849    PT Stop Time  0933    PT Time Calculation (min)  44 min    Activity Tolerance  Patient tolerated treatment well    Behavior During Therapy  [facility] for tasks assessed/performed       Past Medical History:  Diagnosis Date  . Allergy    takes Zyrtec and Singulair daily  . Anxiety   . Breast cancer ([place]) 11/15/2016   right breast  . Carpal tunnel syndrome   . Depression    takes Cymbalta daily  . Genetic testing 12/13/2016   Ms. Wilbon underwent genetic counseling and testing for hereditary cancer syndromes on 11/23/2016.Testing was performed through a custom panel that combined [employer]'s Common Hereditary Cancers Panel with [employer]'s Thyroid Cancer Panel. This custom panel includes analysis of the following 48 genes: APC, ATM, AXIN2, BARD1, BMPR1A, BRCA1, BRCA2, BRIP1, CDH1, CDKN2A, CHEK2, CTNNA1, DICER1, EPCAM, GREM1  . Insomnia    takes Trazodone and Ambien nightly  . Migraines     Past Surgical History:  Procedure Laterality Date  . BREAST BIOPSY Right 11/15/2016   Procedure: RIGHT BREAST EXCISIONAL BIOPSY;  Surgeon: [name], MD;  Location: [place];  Service: General;  Laterality: Right;  . COLONOSCOPY    . KIDNEY DONATION Left     There were no vitals filed for this visit.  Subjective Assessment - 08/27/18 0852    Subjective  The demo went great and my pump was delivered yesterday. I just have to get a rep to come out and set itup for me now. Been wearing my sleeve but not my glove consistently bc it's too tight.  I want to try bandaging my hand today. My swelling was good after last visit, I could tell the massage helped.     Currently in Pain?  No/denies                       [facility] Adult PT Treatment/Exercise - 08/27/18 0001      Manual Therapy   Manual Therapy  Myofascial release;Manual Lymphatic Drainage (MLD);Compression Bandaging;Passive ROM    Myofascial Release  to chest wall with flexion ROM; also along incision    Manual Lymphatic Drainage (MLD)  Short neck, superficial and deep abdominals, Lt axillary nodes, then anterior inter-axillary pathway, Rt inguinal nodes and Rt axillo-inguinal pathway, then briefly to Rt UE working proximal to distal then retracing all steps, focsuing on hand    Compression Bandaging  To Rt hand: massage cream, thin stockinette, Elastomull to fingers 1-4, Artiflex (used partial roll), and 1-6 cm short stretch compression bandage to hand beginning to instruct pt in this and issued handout.     Passive ROM  to the Rt shoulder to tolerance flex, abd, ER, and horizontal abduction                  PT Long Term Goals - 08/14/18 1505      PT LONG TERM GOAL #1   Title  Pt will have decrease in circumference of right hand by .5 cm     Time  4    Period  Weeks    Status  New      PT LONG TERM GOAL #2   Title  Pt will be independent in self MLD and compression wrapping to right hand    Time  4    Period  Weeks    Status  New      PT LONG TERM GOAL #3   Title  Pt will be knowledgable of how to get flat knit compression daytime garments, night garments and compression pump     Time  4    Period  Weeks    Status  New            Plan - 08/27/18 1036    Clinical Impression Statement  Continued with MLD and manual therapy to Rt upper quadrant today and added compression bandaging to Rt hand. She reports this feeling very good at end of session and plans to continue bandaging at home until next session next week.     Rehab Potential   Good    Clinical Impairments Affecting Rehab Potential  17 nodes removed and radiation     PT Frequency  2x / week    PT Duration  4 weeks    PT Treatment/Interventions  ADLs/Self Care Home Management;Therapeutic exercise;Patient/family education;Orthotic Fit/Training;Manual techniques;Manual lymph drainage;Compression bandaging;Taping;Passive range of motion;Scar mobilization    PT Next Visit Plan  Manual lymph drainage, scar mobilizaion and myofascial release to right chest.  how was compression wrapping to right hand/assess technique with this; check the glove that the pt currently has as she reports too tight...better fit after bandaging?        Patient will benefit from skilled therapeutic intervention in order to improve the following deficits and impairments:  Pain, Increased fascial restricitons, Decreased scar mobility, Postural dysfunction, Increased edema  Visit Diagnosis: Postmastectomy lymphedema syndrome  Disorder of the skin and subcutaneous tissue related to radiation, unspecified  Abnormal posture  Stiffness of right shoulder, not elsewhere classified  Stiffness of left shoulder, not elsewhere classified  Acute pain of right shoulder  Muscle weakness (generalized)     Problem List Patient Active Problem List   Diagnosis Date Noted  . GAD (generalized anxiety disorder) 07/04/2018  . Insomnia 07/04/2018  . Genetic testing 12/13/2016  . Malignant neoplasm of upper-outer quadrant of right breast in female, estrogen receptor positive ([place]) 11/23/2016  . Stress at home 02/29/2012    Otelia Limes, PTA 08/27/2018, 10:48 AM  [place], [place], [address] Phone: [phone]   Fax:  [phone]  Name: Beonca Gibb MRN: [id number] Date of Birth: 06/30/1976

## 2018-08-29 ENCOUNTER — Encounter: Payer: 59 | Admitting: Physical Therapy

## 2018-09-03 ENCOUNTER — Ambulatory Visit: Payer: 59

## 2018-09-03 DIAGNOSIS — M6281 Muscle weakness (generalized): Secondary | ICD-10-CM

## 2018-09-03 DIAGNOSIS — I972 Postmastectomy lymphedema syndrome: Secondary | ICD-10-CM

## 2018-09-03 DIAGNOSIS — M25611 Stiffness of right shoulder, not elsewhere classified: Secondary | ICD-10-CM

## 2018-09-03 DIAGNOSIS — R293 Abnormal posture: Secondary | ICD-10-CM

## 2018-09-03 DIAGNOSIS — L599 Disorder of the skin and subcutaneous tissue related to radiation, unspecified: Secondary | ICD-10-CM

## 2018-09-03 DIAGNOSIS — M25612 Stiffness of left shoulder, not elsewhere classified: Secondary | ICD-10-CM

## 2018-09-03 DIAGNOSIS — M25511 Pain in right shoulder: Secondary | ICD-10-CM

## 2018-09-03 NOTE — Therapy (Signed)
[place], [place], [address] Phone: [phone]   Fax:  [phone]  Physical Therapy Treatment  Patient Details  Name: Kimberly Maldonado MRN: [id number] Date of Birth: Mar 26, 1976 Referring Provider (PT): Amy Ovid Curd   Encounter Date: 09/03/2018  PT End of Session - 09/03/18 1113    Visit Number  4    Number of Visits  9    Date for PT Re-Evaluation  09/12/18    PT Start Time  0851    PT Stop Time  0937    PT Time Calculation (min)  46 min    Activity Tolerance  Patient tolerated treatment well    Behavior During Therapy  [facility] for tasks assessed/performed       Past Medical History:  Diagnosis Date  . Allergy    takes Zyrtec and Singulair daily  . Anxiety   . Breast cancer ([place]) 11/15/2016   right breast  . Carpal tunnel syndrome   . Depression    takes Cymbalta daily  . Genetic testing 12/13/2016   Ms. Manalo underwent genetic counseling and testing for hereditary cancer syndromes on 11/23/2016.Testing was performed through a custom panel that combined [employer]'s Common Hereditary Cancers Panel with [employer]'s Thyroid Cancer Panel. This custom panel includes analysis of the following 48 genes: APC, ATM, AXIN2, BARD1, BMPR1A, BRCA1, BRCA2, BRIP1, CDH1, CDKN2A, CHEK2, CTNNA1, DICER1, EPCAM, GREM1  . Insomnia    takes Trazodone and Ambien nightly  . Migraines     Past Surgical History:  Procedure Laterality Date  . BREAST BIOPSY Right 11/15/2016   Procedure: RIGHT BREAST EXCISIONAL BIOPSY;  Surgeon: [name], MD;  Location: [place];  Service: General;  Laterality: Right;  . COLONOSCOPY    . KIDNEY DONATION Left     There were no vitals filed for this visit.  Subjective Assessment - 09/03/18 0856    Subjective  My hand is still frustrating me but the bandaging did help some. Also was able to get the pump rep over on Friday and so I've gotten to use it since then every day and I think it's also  helping.     Currently in Pain?  No/denies            LYMPHEDEMA/ONCOLOGY QUESTIONNAIRE - 09/03/18 0926      Right Upper Extremity Lymphedema   15 cm Proximal to Olecranon Process  29.9 cm    Olecranon Process  25.4 cm    15 cm Proximal to Ulnar Styloid Process  24.5 cm    Just Proximal to Ulnar Styloid Process  16.4 cm    Across Hand at [employer]  20 cm    At [place] of 2nd Digit  6.2 cm                [facility] Adult PT Treatment/Exercise - 09/03/18 0001      Manual Therapy   Myofascial Release  to chest wall with flexion ROM; also along incision    Manual Lymphatic Drainage (MLD)  Short neck, superficial and deep abdominals, Lt axillary nodes, then anterior inter-axillary pathway, Rt inguinal nodes and Rt axillo-inguinal pathway, then briefly to Rt UE working proximal to distal then retracing all steps, focsuing on hand    Compression Bandaging  To Rt hand: massage cream, thin stockinette, Elastomull to fingers 1-4, Artiflex (used partial roll), and 1-6 cm short stretch compression bandage to hand beginning to instruct pt in this and issued handout.     Passive  ROM  to the Rt shoulder to tolerance flex, abd, and D2                  PT Long Term Goals - 08/14/18 1505      PT LONG TERM GOAL #1   Title  Pt will have decrease in circumference of right hand by .5 cm     Time  4    Period  Weeks    Status  New      PT LONG TERM GOAL #2   Title  Pt will be independent in self MLD and compression wrapping to right hand    Time  4    Period  Weeks    Status  New      PT LONG TERM GOAL #3   Title  Pt will be knowledgable of how to get flat knit compression daytime garments, night garments and compression pump     Time  4    Period  Weeks    Status  New            Plan - 09/03/18 1114    Clinical Impression Statement  Continued with MLD and end of ROM stretching. Pts circumference measurements have improved since last time so also continued with  compression bandaging, reviewing this with pt while applying.     Rehab Potential  Good    Clinical Impairments Affecting Rehab Potential  17 nodes removed and radiation     PT Frequency  2x / week    PT Duration  4 weeks    PT Treatment/Interventions  ADLs/Self Care Home Management;Therapeutic exercise;Patient/family education;Orthotic Fit/Training;Manual techniques;Manual lymph drainage;Compression bandaging;Taping;Passive range of motion;Scar mobilization    PT Next Visit Plan  Manual lymph drainage, scar mobilizaion and myofascial release to right chest. Check the glove that the pt currently has as she reports too tight...better fit after bandaging?     Consulted and Agree with Plan of Care  Patient       Patient will benefit from skilled therapeutic intervention in order to improve the following deficits and impairments:  Pain, Increased fascial restricitons, Decreased scar mobility, Postural dysfunction, Increased edema  Visit Diagnosis: Postmastectomy lymphedema syndrome  Disorder of the skin and subcutaneous tissue related to radiation, unspecified  Abnormal posture  Stiffness of right shoulder, not elsewhere classified  Stiffness of left shoulder, not elsewhere classified  Acute pain of right shoulder  Muscle weakness (generalized)     Problem List Patient Active Problem List   Diagnosis Date Noted  . GAD (generalized anxiety disorder) 07/04/2018  . Insomnia 07/04/2018  . Genetic testing 12/13/2016  . Malignant neoplasm of upper-outer quadrant of right breast in female, estrogen receptor positive ([place]) 11/23/2016  . Stress at home 02/29/2012    Otelia Limes, PTA 09/03/2018, 11:32 AM  [place], [place], [address] Phone: [phone]   Fax:  [phone]  Name: Kimberly Maldonado MRN: [id number] Date of Birth: 1976-05-10

## 2018-09-06 ENCOUNTER — Ambulatory Visit: Payer: 59 | Admitting: Physical Therapy

## 2018-09-09 ENCOUNTER — Encounter: Payer: Self-pay | Admitting: Physical Therapy

## 2018-09-09 ENCOUNTER — Ambulatory Visit: Payer: 59 | Attending: Radiology | Admitting: Physical Therapy

## 2018-09-09 ENCOUNTER — Other Ambulatory Visit: Payer: Self-pay

## 2018-09-09 DIAGNOSIS — I972 Postmastectomy lymphedema syndrome: Secondary | ICD-10-CM | POA: Insufficient documentation

## 2018-09-09 DIAGNOSIS — M25611 Stiffness of right shoulder, not elsewhere classified: Secondary | ICD-10-CM | POA: Diagnosis not present

## 2018-09-09 DIAGNOSIS — L599 Disorder of the skin and subcutaneous tissue related to radiation, unspecified: Secondary | ICD-10-CM | POA: Diagnosis not present

## 2018-09-09 DIAGNOSIS — R293 Abnormal posture: Secondary | ICD-10-CM | POA: Insufficient documentation

## 2018-09-09 NOTE — Therapy (Signed)
[place], [place], [address] Phone: [phone]   Fax:  [phone]  Physical Therapy Treatment  Patient Details  Name: Kimberly Maldonado MRN: [id number] Date of Birth: 1976/03/14 Referring Provider (PT): Amy Ovid Curd   Encounter Date: 09/09/2018  PT End of Session - 09/09/18 0934    Visit Number  5    Number of Visits  9    Date for PT Re-Evaluation  09/12/18    PT Start Time  0848    PT Stop Time  0933    PT Time Calculation (min)  45 min    Activity Tolerance  Patient tolerated treatment well    Behavior During Therapy  [facility] for tasks assessed/performed       Past Medical History:  Diagnosis Date  . Allergy    takes Zyrtec and Singulair daily  . Anxiety   . Breast cancer ([place]) 11/15/2016   right breast  . Carpal tunnel syndrome   . Depression    takes Cymbalta daily  . Genetic testing 12/13/2016   Ms. Vanorman underwent genetic counseling and testing for hereditary cancer syndromes on 11/23/2016.Testing was performed through a custom panel that combined [employer]'s Common Hereditary Cancers Panel with [employer]'s Thyroid Cancer Panel. This custom panel includes analysis of the following 48 genes: APC, ATM, AXIN2, BARD1, BMPR1A, BRCA1, BRCA2, BRIP1, CDH1, CDKN2A, CHEK2, CTNNA1, DICER1, EPCAM, GREM1  . Insomnia    takes Trazodone and Ambien nightly  . Migraines     Past Surgical History:  Procedure Laterality Date  . BREAST BIOPSY Right 11/15/2016   Procedure: RIGHT BREAST EXCISIONAL BIOPSY;  Surgeon: [name], MD;  Location: [place];  Service: General;  Laterality: Right;  . COLONOSCOPY    . KIDNEY DONATION Left     There were no vitals filed for this visit.  Subjective Assessment - 09/09/18 0850    Subjective  I am supposed to be measured for a new glove.     Currently in Pain?  No/denies                       [facility] Adult PT Treatment/Exercise - 09/09/18 0001      Manual  Therapy   Myofascial Release  to chest wall with flexion ROM; also along incision    Manual Lymphatic Drainage (MLD)  Short neck, superficial and deep abdominals, Lt axillary nodes, then anterior inter-axillary pathway, Rt inguinal nodes and Rt axillo-inguinal pathway, then briefly to Rt UE working proximal to distal then retracing all steps, focsuing on hand    Compression Bandaging  To Rt hand: massage cream, thin stockinette, Elastomull to fingers 1-4, Artiflex (used partial roll), and 1-6 cm short stretch compression bandage to hand beginning to instruct pt in this and issued handout.     Passive ROM  to the Rt shoulder to tolerance flex, abd, and D2                  PT Long Term Goals - 08/14/18 1505      PT LONG TERM GOAL #1   Title  Pt will have decrease in circumference of right hand by .5 cm     Time  4    Period  Weeks    Status  New      PT LONG TERM GOAL #2   Title  Pt will be independent in self MLD and compression wrapping to right hand    Time  4  Period  Weeks    Status  New      PT LONG TERM GOAL #3   Title  Pt will be knowledgable of how to get flat knit compression daytime garments, night garments and compression pump     Time  4    Period  Weeks    Status  New            Plan - 09/09/18 0935    Clinical Impression Statement  Pt has not been scheduled to be measured again for a compression glove. Contacted DME supplier and left a message. Pt should be scheduled today or March 16 to be measured. Continued with MLD to R hand and PROM to R shoulder.     Rehab Potential  Good    Clinical Impairments Affecting Rehab Potential  17 nodes removed and radiation     PT Frequency  2x / week    PT Duration  4 weeks    PT Treatment/Interventions  ADLs/Self Care Home Management;Therapeutic exercise;Patient/family education;Orthotic Fit/Training;Manual techniques;Manual lymph drainage;Compression bandaging;Taping;Passive range of motion;Scar mobilization     PT Next Visit Plan  see if Melissa contatcted pt to set up measuring, Manual lymph drainage, scar mobilizaion and myofascial release to right chest. Check the glove that the pt currently has as she reports too tight...better fit after bandaging?     Recommended Other Services  left vm for Melissa who should schedule pt either 3/2 or 3/16    Consulted and Agree with Plan of Care  Patient       Patient will benefit from skilled therapeutic intervention in order to improve the following deficits and impairments:  Pain, Increased fascial restricitons, Decreased scar mobility, Postural dysfunction, Increased edema  Visit Diagnosis: Postmastectomy lymphedema syndrome  Disorder of the skin and subcutaneous tissue related to radiation, unspecified  Stiffness of right shoulder, not elsewhere classified     Problem List Patient Active Problem List   Diagnosis Date Noted  . GAD (generalized anxiety disorder) 07/04/2018  . Insomnia 07/04/2018  . Genetic testing 12/13/2016  . Malignant neoplasm of upper-outer quadrant of right breast in female, estrogen receptor positive ([place]) 11/23/2016  . Stress at home 02/29/2012    [place] 09/09/2018, 9:37 AM  [place], [place], [address] Phone: [phone]   Fax:  [phone]  Name: Marcy Sookdeo MRN: [id number] Date of Birth: 07-10-1976  Manus Gunning, PT 09/09/18 9:37 AM

## 2018-09-10 ENCOUNTER — Ambulatory Visit: Payer: 59

## 2018-09-10 DIAGNOSIS — R293 Abnormal posture: Secondary | ICD-10-CM

## 2018-09-10 DIAGNOSIS — I972 Postmastectomy lymphedema syndrome: Secondary | ICD-10-CM | POA: Diagnosis not present

## 2018-09-10 DIAGNOSIS — M25611 Stiffness of right shoulder, not elsewhere classified: Secondary | ICD-10-CM

## 2018-09-10 DIAGNOSIS — L599 Disorder of the skin and subcutaneous tissue related to radiation, unspecified: Secondary | ICD-10-CM

## 2018-09-10 NOTE — Therapy (Addendum)
[place], [place], [address] Phone: [phone]   Fax:  [phone]  Physical Therapy Treatment  Patient Details  Name: Japleen Tornow MRN: [id number] Date of Birth: 1976-04-07 Referring Provider (PT): Amy Ovid Curd   Encounter Date: 09/10/2018  PT End of Session - 09/10/18 0933    Visit Number  6    Number of Visits  9    Date for PT Re-Evaluation  09/12/18    PT Start Time  0850    PT Stop Time  0933    PT Time Calculation (min)  43 min    Activity Tolerance  Patient tolerated treatment well    Behavior During Therapy  [facility] for tasks assessed/performed       Past Medical History:  Diagnosis Date  . Allergy    takes Zyrtec and Singulair daily  . Anxiety   . Breast cancer ([place]) 11/15/2016   right breast  . Carpal tunnel syndrome   . Depression    takes Cymbalta daily  . Genetic testing 12/13/2016   Ms. Keeran underwent genetic counseling and testing for hereditary cancer syndromes on 11/23/2016.Testing was performed through a custom panel that combined [employer]'s Common Hereditary Cancers Panel with [employer]'s Thyroid Cancer Panel. This custom panel includes analysis of the following 48 genes: APC, ATM, AXIN2, BARD1, BMPR1A, BRCA1, BRCA2, BRIP1, CDH1, CDKN2A, CHEK2, CTNNA1, DICER1, EPCAM, GREM1  . Insomnia    takes Trazodone and Ambien nightly  . Migraines     Past Surgical History:  Procedure Laterality Date  . BREAST BIOPSY Right 11/15/2016   Procedure: RIGHT BREAST EXCISIONAL BIOPSY;  Surgeon: [name], MD;  Location: [place];  Service: General;  Laterality: Right;  . COLONOSCOPY    . KIDNEY DONATION Left     There were no vitals filed for this visit.  Subjective Assessment - 09/10/18 0853    Subjective  I got measured for a new sleeve and glove yesterday and she said it should arrive it about 2 weeks at my house.     Patient Stated Goals  to get my hand swelling back under control and have  a plan for maintaining it    Currently in Pain?  No/denies                       [facility] Adult PT Treatment/Exercise - 09/10/18 0001      Manual Therapy   Myofascial Release  to chest wall with flexion ROM; also along incision    Manual Lymphatic Drainage (MLD)  Short neck, superficial and deep abdominals, Lt axillary nodes, then anterior inter-axillary pathway, Rt inguinal nodes and Rt axillo-inguinal pathway, then briefly to Rt UE working proximal to distal then retracing all steps, focsuing on hand    Passive ROM  to the Rt shoulder to tolerance flex, abd, and D2                  PT Long Term Goals - 08/14/18 1505      PT LONG TERM GOAL #1   Title  Pt will have decrease in circumference of right hand by .5 cm     Time  4    Period  Weeks    Status  New      PT LONG TERM GOAL #2   Title  Pt will be independent in self MLD and compression wrapping to right hand    Time  4    Period  Weeks  Status  New      PT LONG TERM GOAL #3   Title  Pt will be knowledgable of how to get flat knit compression daytime garments, night garments and compression pump     Time  4    Period  Weeks    Status  New            Plan - 09/10/18 0936    Clinical Impression Statement  Pt was measurd yesterday for compression sleeve and glove. This should arrive within next 2 weeks. Continued with focus on manual lymph drainage and end ROM and decreasing myofascial tightness at Rt upper quadrant. Pt reports much relief of typical tightness at end of session.      Rehab Potential  Good    Clinical Impairments Affecting Rehab Potential  17 nodes removed and radiation     PT Frequency  2x / week    PT Duration  4 weeks    PT Treatment/Interventions  ADLs/Self Care Home Management;Therapeutic exercise;Patient/family education;Orthotic Fit/Training;Manual techniques;Manual lymph drainage;Compression bandaging;Taping;Passive range of motion;Scar mobilization    PT Next Visit  Plan  Manual lymph drainage, scar mobilizaion and myofascial release to right chest. Cont to see pt until garments arrive    Consulted and Agree with Plan of Care  Patient       Patient will benefit from skilled therapeutic intervention in order to improve the following deficits and impairments:  Pain, Increased fascial restricitons, Decreased scar mobility, Postural dysfunction, Increased edema  Visit Diagnosis: Postmastectomy lymphedema syndrome  Disorder of the skin and subcutaneous tissue related to radiation, unspecified  Stiffness of right shoulder, not elsewhere classified  Abnormal posture     Problem List Patient Active Problem List   Diagnosis Date Noted  . GAD (generalized anxiety disorder) 07/04/2018  . Insomnia 07/04/2018  . Genetic testing 12/13/2016  . Malignant neoplasm of upper-outer quadrant of right breast in female, estrogen receptor positive ([place]) 11/23/2016  . Stress at home 02/29/2012    Otelia Limes, PTA 09/10/2018, 9:38 AM  [place], [place], [address] Phone: [phone]   Fax:  [phone]  Name: Tayden Duran MRN: [id number] Date of Birth: June 01, 1976  PHYSICAL THERAPY DISCHARGE SUMMARY  Visits from Start of Care: 6  Current functional level related to goals / functional outcomes: See above   Remaining deficits: See above   Education / Equipment: Compression garments, self MLD  Plan: Patient agrees to discharge.  Patient goals were not met. Patient is being discharged due to the patient's request.  ?????    Allyson Sabal [place], [place] 11/13/18 11:06 AM

## 2018-09-11 DIAGNOSIS — D37039 Neoplasm of uncertain behavior of the major salivary glands, unspecified: Secondary | ICD-10-CM | POA: Diagnosis not present

## 2018-09-23 DIAGNOSIS — Z170 Estrogen receptor positive status [ER+]: Secondary | ICD-10-CM | POA: Diagnosis not present

## 2018-09-23 DIAGNOSIS — C50411 Malignant neoplasm of upper-outer quadrant of right female breast: Secondary | ICD-10-CM | POA: Diagnosis not present

## 2018-09-23 DIAGNOSIS — Z79899 Other long term (current) drug therapy: Secondary | ICD-10-CM | POA: Diagnosis not present

## 2018-09-25 ENCOUNTER — Other Ambulatory Visit: Payer: Self-pay

## 2018-09-25 ENCOUNTER — Other Ambulatory Visit (HOSPITAL_COMMUNITY): Payer: Self-pay | Admitting: Otolaryngology

## 2018-09-25 DIAGNOSIS — D37039 Neoplasm of uncertain behavior of the major salivary glands, unspecified: Secondary | ICD-10-CM

## 2018-09-25 MED ORDER — VENLAFAXINE HCL 37.5 MG PO TABS: 112.5000 mg | ORAL_TABLET | Freq: Every day | ORAL | 3 refills | Status: DC

## 2018-09-25 MED ORDER — TRAZODONE HCL 50 MG PO TABS: 50.0000 mg | ORAL_TABLET | Freq: Every day | ORAL | 3 refills | Status: DC

## 2018-09-25 NOTE — Progress Notes (Signed)
Refill request from [facility] for trazodone 50mg  1 at hs and venlafaxine er 37.5 3 capsules daily. 90 day submitted and 3 additional refills  Next office visit 04/24/2019

## 2018-09-27 ENCOUNTER — Telehealth: Payer: Self-pay | Admitting: Psychiatry

## 2018-09-27 ENCOUNTER — Other Ambulatory Visit: Payer: Self-pay

## 2018-09-27 MED ORDER — VENLAFAXINE HCL ER 37.5 MG PO CP24: 112.5000 mg | ORAL_CAPSULE | Freq: Every day | ORAL | 1 refills | Status: DC

## 2018-09-27 NOTE — Telephone Encounter (Signed)
Clarified ER

## 2018-09-27 NOTE — Telephone Encounter (Signed)
Need to review paper chart, not put in epic correctly.

## 2018-09-27 NOTE — Telephone Encounter (Signed)
Patient stated wrong script was sent it should be for effexor extended release and not immediate release

## 2018-10-23 ENCOUNTER — Encounter: Payer: Self-pay | Admitting: Internal Medicine

## 2018-12-03 DIAGNOSIS — Z780 Asymptomatic menopausal state: Secondary | ICD-10-CM | POA: Diagnosis not present

## 2018-12-03 DIAGNOSIS — C50411 Malignant neoplasm of upper-outer quadrant of right female breast: Secondary | ICD-10-CM | POA: Diagnosis not present

## 2019-01-06 ENCOUNTER — Encounter: Payer: Self-pay | Admitting: Internal Medicine

## 2019-01-06 ENCOUNTER — Other Ambulatory Visit: Payer: Self-pay | Admitting: Psychiatry

## 2019-01-25 ENCOUNTER — Other Ambulatory Visit: Payer: Self-pay | Admitting: Internal Medicine

## 2019-01-25 DIAGNOSIS — Z20822 Contact with and (suspected) exposure to covid-19: Secondary | ICD-10-CM

## 2019-01-29 LAB — NOVEL CORONAVIRUS, NAA: SARS-CoV-2, NAA: NOT DETECTED

## 2019-02-03 ENCOUNTER — Other Ambulatory Visit: Payer: Self-pay | Admitting: Psychiatry

## 2019-03-05 ENCOUNTER — Encounter: Payer: Self-pay | Admitting: Internal Medicine

## 2019-04-24 ENCOUNTER — Ambulatory Visit: Payer: 59 | Admitting: Psychiatry

## 2019-04-25 LAB — HM COLONOSCOPY

## 2019-04-29 ENCOUNTER — Encounter: Payer: Self-pay | Admitting: Internal Medicine

## 2019-05-09 ENCOUNTER — Encounter: Payer: Self-pay | Admitting: Psychiatry

## 2019-05-09 ENCOUNTER — Ambulatory Visit (INDEPENDENT_AMBULATORY_CARE_PROVIDER_SITE_OTHER): Payer: 59 | Admitting: Psychiatry

## 2019-05-09 ENCOUNTER — Other Ambulatory Visit: Payer: Self-pay

## 2019-05-09 DIAGNOSIS — F5105 Insomnia due to other mental disorder: Secondary | ICD-10-CM | POA: Diagnosis not present

## 2019-05-09 DIAGNOSIS — F331 Major depressive disorder, recurrent, moderate: Secondary | ICD-10-CM

## 2019-05-09 DIAGNOSIS — F411 Generalized anxiety disorder: Secondary | ICD-10-CM

## 2019-05-09 MED ORDER — TRAZODONE HCL 50 MG PO TABS: 50.0000 mg | ORAL_TABLET | Freq: Every day | ORAL | 3 refills | Status: DC

## 2019-05-09 MED ORDER — ZOLPIDEM TARTRATE 5 MG PO TABS: 5.0000 mg | ORAL_TABLET | Freq: Every day | ORAL | 5 refills | Status: DC

## 2019-05-09 MED ORDER — VENLAFAXINE HCL ER 37.5 MG PO CP24: 112.5000 mg | ORAL_CAPSULE | Freq: Every day | ORAL | 3 refills | Status: DC

## 2019-05-09 NOTE — Progress Notes (Signed)
Kimberly Maldonado [id number] 29-Apr-1976 43 y.o.  Subjective:   Patient ID:  Kimberly Maldonado is a 43 y.o. (DOB 02-Dec-1975) female.  Chief Complaint:  Chief Complaint  Patient presents with  . Follow-up    Medication Management  . Anxiety    Medication Management    Anxiety Patient reports no confusion, decreased concentration, dizziness, nervous/anxious behavior or suicidal ideas.     Kimberly Maldonado presents to the office today for follow-up of depression and anxiety.  Last seen January 2020.  She was doing well no meds were changed.  Still doing really good.  No unusual depressive episodes.  Doing great re: mood.  Patient reports stable mood and denies depressed or irritable moods.  Patient denies any recent difficulty with anxiety.  Patient denies difficulty with sleep initiation or maintenance except needs trazodone and Ambien both to sleep.  Had some night eating if stays up and she understands it's her fault. Denies appetite disturbance.  Patient reports that energy and motivation have been good overall except DT chemo.  Patient denies any difficulty with concentration.  Patient denies any suicidal ideation. Less hot flashes with Effexor.  Breast CA chemo study ongoing.  Off and on fatigue with it.  Finishes March.  Previous psychiatric medication trials include temazepam, Ambien CR, trazodone,  Wellbutrin, duloxetine, Lexapro, Viibryd,  Review of Systems:  Review of Systems  Constitutional: Positive for fatigue.  Neurological: Negative for dizziness, tremors and weakness.  Psychiatric/Behavioral: Negative for agitation, behavioral problems, confusion, decreased concentration, dysphoric mood, hallucinations, self-injury, sleep disturbance and suicidal ideas. The patient is not nervous/anxious and is not hyperactive.     Medications: I have reviewed the patient's current medications.  Current Outpatient Medications  Medication Sig Dispense Refill  . abemaciclib (VERZENIO) 50 MG  tablet Take 50 mg by mouth 2 (two) times daily.    . cetirizine (ZYRTEC) 10 MG tablet Take 10 mg by mouth daily as needed for allergies.    Marland Kitchen exemestane (AROMASIN) 25 MG tablet exemestane 25 mg tablet    . folic acid (FOLVITE) 1 MG tablet Take 1 mg by mouth daily.    Marland Kitchen goserelin (ZOLADEX) 10.8 MG injection Inject 10.8 mg into the skin once.    Marland Kitchen ibuprofen (ADVIL,MOTRIN) 600 MG tablet Take 1 tablet (600 mg total) by mouth every 6 (six) hours as needed. 40 tablet 1  . NON FORMULARY Take by mouth. Chemo    . pseudoephedrine-guaifenesin (MUCINEX D) 60-600 MG 12 hr tablet Take 1 tablet by mouth 2 (two) times daily as needed for congestion.    . traZODone (DESYREL) 50 MG tablet Take 1 tablet (50 mg total) by mouth at bedtime. 90 tablet 3  . tretinoin (RETIN-A) 0.1 % cream tretinoin 0.1 % topical cream    . valACYclovir (VALTREX) 500 MG tablet TAKE 1 TABLET BY ORAL ROUTE EVERY DAY AS NEEDED 30 tablet 0  . venlafaxine XR (EFFEXOR-XR) 37.5 MG 24 hr capsule Take 3 capsules (112.5 mg total) by mouth daily with breakfast. 270 capsule 3  . vitamin B-12 (CYANOCOBALAMIN) 1000 MCG tablet Take 2,000 mcg by mouth daily.    Marland Kitchen zolpidem (AMBIEN) 5 MG tablet Take 1 tablet (5 mg total) by mouth at bedtime. 30 tablet 5   No current facility-administered medications for this visit.     Medication Side Effects: None  Allergies: No Known Allergies  Past Medical History:  Diagnosis Date  . Allergy    takes Zyrtec and Singulair daily  . Anxiety   . Breast cancer (  [place]) 11/15/2016   right breast  . Carpal tunnel syndrome   . Depression    takes Cymbalta daily  . Genetic testing 12/13/2016   Kimberly Maldonado underwent genetic counseling and testing for hereditary cancer syndromes on 11/23/2016.Testing was performed through a custom panel that combined [employer]'s Common Hereditary Cancers Panel with [employer]'s Thyroid Cancer Panel. This custom panel includes analysis of the following 48 genes: APC, ATM, AXIN2, BARD1, BMPR1A,  BRCA1, BRCA2, BRIP1, CDH1, CDKN2A, CHEK2, CTNNA1, DICER1, EPCAM, GREM1  . Insomnia    takes Trazodone and Ambien nightly  . Migraines     Family History  Problem Relation Age of Onset  . COPD Maternal Grandmother   . Hypertension Maternal Grandfather   . Prostate cancer Maternal Grandfather 47  . Bone cancer Maternal Grandfather 27  . Diabetes Mother   . Breast cancer Maternal Aunt 42  . Thyroid cancer Other 40       d.44s    Social History   Socioeconomic History  . Marital status: Divorced    Spouse name: Not on file  . Number of children: Not on file  . Years of education: Not on file  . Highest education level: Not on file  Occupational History  . Not on file  Social Needs  . Financial resource strain: Not on file  . Food insecurity    Worry: Not on file    Inability: Not on file  . Transportation needs    Medical: Not on file    Non-medical: Not on file  Tobacco Use  . Smoking status: Never Smoker  . Smokeless tobacco: Never Used  . Tobacco comment: quit smoking 21 yrs ago  Substance and Sexual Activity  . Alcohol use: Yes  . Drug use: No  . Sexual activity: Yes    Birth control/protection: Inserts    Comment: nuva ring  Lifestyle  . Physical activity    Days per week: Not on file    Minutes per session: Not on file  . Stress: Not on file  Relationships  . Social Herbalist on phone: Not on file    Gets together: Not on file    Attends religious service: Not on file    Active member of club or organization: Not on file    Attends meetings of clubs or organizations: Not on file    Relationship status: Not on file  . Intimate partner violence    Fear of current or ex partner: Not on file    Emotionally abused: Not on file    Physically abused: Not on file    Forced sexual activity: Not on file  Other Topics Concern  . Not on file  Social History Narrative  . Not on file    Past Medical History, Surgical history, Social history, and  Family history were reviewed and updated as appropriate.   Please see review of systems for further details on the patient's review from today.   Objective:   Physical Exam:  There were no vitals taken for this visit.  Physical Exam Neurological:     Mental Status: She is alert and oriented to person, place, and time.     Cranial Nerves: No dysarthria.  Psychiatric:        Attention and Perception: Attention normal.        Mood and Affect: Mood normal. Mood is not anxious or depressed.        Speech: Speech normal.  Behavior: Behavior is cooperative.        Thought Content: Thought content normal. Thought content is not paranoid or delusional. Thought content does not include homicidal or suicidal ideation. Thought content does not include homicidal or suicidal plan.        Cognition and Memory: Cognition and memory normal.        Judgment: Judgment normal.     Lab Review:     Component Value Date/Time   NA 139 11/23/2016 1530   K 4.0 11/23/2016 1530   CL 108 11/08/2016 0923   CO2 26 11/23/2016 1530   GLUCOSE 85 11/23/2016 1530   BUN 12.8 11/23/2016 1530   CREATININE 0.9 11/23/2016 1530   CALCIUM 9.3 11/23/2016 1530   PROT 7.2 11/23/2016 1530   ALBUMIN 3.9 11/23/2016 1530   AST 17 11/23/2016 1530   ALT 22 11/23/2016 1530   ALKPHOS 41 11/23/2016 1530   BILITOT 0.31 11/23/2016 1530   GFRNONAA >60 11/08/2016 0923   GFRAA >60 11/08/2016 0923       Component Value Date/Time   WBC 5.1 11/23/2016 1530   WBC 4.4 11/08/2016 0923   RBC 4.25 11/23/2016 1530   RBC 4.23 11/08/2016 0923   HGB 12.9 11/23/2016 1530   HCT 39.0 11/23/2016 1530   PLT 219 11/23/2016 1530   MCV 91.7 11/23/2016 1530   MCH 30.2 11/23/2016 1530   MCH 29.8 11/08/2016 0923   MCHC 33.0 11/23/2016 1530   MCHC 33.2 11/08/2016 0923   RDW 14.0 11/23/2016 1530   LYMPHSABS 2.0 11/23/2016 1530   MONOABS 0.7 11/23/2016 1530   EOSABS 0.1 11/23/2016 1530   BASOSABS 0.0 11/23/2016 1530    No results  found for: POCLITH, LITHIUM   No results found for: PHENYTOIN, PHENOBARB, VALPROATE, CBMZ   .res Assessment: Plan:    Major depressive disorder, recurrent episode, moderate ([place]) - Plan: venlafaxine XR (EFFEXOR-XR) 37.5 MG 24 hr capsule  Generalized anxiety disorder - Plan: venlafaxine XR (EFFEXOR-XR) 37.5 MG 24 hr capsule  Insomnia due to mental condition - Plan: zolpidem (AMBIEN) 5 MG tablet, traZODone (DESYREL) 50 MG tablet   Kimberly Maldonado has had a good response to Effexor controlling anxiety and depression and trazodone and Ambien are required for her insomnia but that is also working well without side effects.  The Effexor is helping to suppress hot flashes associated with chemo.  Physically she is doing well with just a little bit of fatigue from the chemo.  Cancer is under control at this time.  She agrees she needs to continue the Effexor long-term because of a history of recurrent depression and anxiety.  Discussed serotonin withdrawal.  Supportive therapy on self-care associated with the stress of having cancer and also to help keep depression and anxiety under control.  Call us if she has any relapse symptoms.  She is done well managing this.  Be careful with Ambien.  She wants to try to taper sleep meds after March.  Try to wean Ambien first by 1/4 tablet every 3-4 weeks and then try stopping trazodone.  Discussed Ambien amnesia and rebound insomnia if she stops abruptly.  No med changes indicated  Follow-up 9-12 months  Hiram Comber, MD, DFAPA  Please see After Visit Summary for patient specific instructions.  No future appointments.  No orders of the defined types were placed in this encounter.     -------------------------------

## 2019-05-11 HISTORY — PX: COLONOSCOPY: SHX174

## 2019-06-29 ENCOUNTER — Other Ambulatory Visit: Payer: Self-pay | Admitting: Internal Medicine

## 2019-09-04 ENCOUNTER — Ambulatory Visit: Payer: 59 | Attending: Family

## 2019-09-04 DIAGNOSIS — Z23 Encounter for immunization: Secondary | ICD-10-CM | POA: Insufficient documentation

## 2019-09-04 NOTE — Progress Notes (Signed)
   Covid-19 Vaccination Clinic  Name:  Kimberly Maldonado    MRN: [id number] DOB: 1976-04-05  09/04/2019  [name] was observed post Covid-19 immunization for 15 minutes without incidence. She was provided with Vaccine Information Sheet and instruction to access the V-Safe system.   [name] was instructed to call 911 with any severe reactions post vaccine: Marland Kitchen Difficulty breathing  . Swelling of your face and throat  . A fast heartbeat  . A bad rash all over your body  . Dizziness and weakness    Immunizations Administered    Name Date Dose VIS Date Route   Moderna COVID-19 Vaccine 09/04/2019 10:22 AM 0.5 mL 06/10/2019 Intramuscular   Manufacturer: Moderna   Lot: [id number]   [place]:[id number]

## 2019-10-07 ENCOUNTER — Ambulatory Visit: Payer: 59 | Attending: Family

## 2019-10-07 DIAGNOSIS — Z23 Encounter for immunization: Secondary | ICD-10-CM

## 2019-10-07 NOTE — Progress Notes (Signed)
   Covid-19 Vaccination Clinic  Name:  Kimberly Maldonado    MRN: [id number] DOB: [birth year]  10/07/2019  Kimberly Maldonado was observed post Covid-19 immunization for 15 minutes without incident. She was provided with Vaccine Information Sheet and instruction to access the V-Safe system.   Kimberly Maldonado was instructed to call 911 with any severe reactions post vaccine: Marland Kitchen Difficulty breathing  . Swelling of face and throat  . A fast heartbeat  . A bad rash all over body  . Dizziness and weakness   Immunizations Administered    Name Date Dose VIS Date Route   Moderna COVID-19 Vaccine 10/07/2019 10:13 AM 0.5 mL 06/10/2019 Intramuscular   Manufacturer: Moderna   LotFP:[id number]   [place]BE:[id number]

## 2019-10-08 ENCOUNTER — Encounter: Payer: Self-pay | Admitting: Internal Medicine

## 2019-10-20 ENCOUNTER — Encounter: Payer: Self-pay | Admitting: Internal Medicine

## 2019-10-20 ENCOUNTER — Other Ambulatory Visit: Payer: Self-pay

## 2019-10-20 ENCOUNTER — Ambulatory Visit (INDEPENDENT_AMBULATORY_CARE_PROVIDER_SITE_OTHER): Payer: 59 | Admitting: Internal Medicine

## 2019-10-20 VITALS — BP 120/64 | HR 86 | Temp 98.3°F | Ht 67.0 in | Wt 189.6 lb

## 2019-10-20 DIAGNOSIS — K118 Other diseases of salivary glands: Secondary | ICD-10-CM

## 2019-10-20 DIAGNOSIS — C50411 Malignant neoplasm of upper-outer quadrant of right female breast: Secondary | ICD-10-CM | POA: Diagnosis not present

## 2019-10-20 DIAGNOSIS — F331 Major depressive disorder, recurrent, moderate: Secondary | ICD-10-CM | POA: Insufficient documentation

## 2019-10-20 DIAGNOSIS — Z0000 Encounter for general adult medical examination without abnormal findings: Secondary | ICD-10-CM

## 2019-10-20 DIAGNOSIS — E559 Vitamin D deficiency, unspecified: Secondary | ICD-10-CM | POA: Diagnosis not present

## 2019-10-20 DIAGNOSIS — Z170 Estrogen receptor positive status [ER+]: Secondary | ICD-10-CM

## 2019-10-20 NOTE — Patient Instructions (Signed)
Health Maintenance, Female Adopting a healthy lifestyle and getting preventive care are important in promoting health and wellness. Ask your health care provider about:  The right schedule for you to have regular tests and exams.  Things you can do on your own to prevent diseases and keep yourself healthy. What should I know about diet, weight, and exercise? Eat a healthy diet   Eat a diet that includes plenty of vegetables, fruits, low-fat dairy products, and lean protein.  Do not eat a lot of foods that are high in solid fats, added sugars, or sodium. Maintain a healthy weight Body mass index (BMI) is used to identify weight problems. It estimates body fat based on height and weight. Your health care provider can help determine your BMI and help you achieve or maintain a healthy weight. Get regular exercise Get regular exercise. This is one of the most important things you can do for your health. Most adults should:  Exercise for at least 150 minutes each week. The exercise should increase your heart rate and make you sweat (moderate-intensity exercise).  Do strengthening exercises at least twice a week. This is in addition to the moderate-intensity exercise.  Spend less time sitting. Even light physical activity can be beneficial. Watch cholesterol and blood lipids Have your blood tested for lipids and cholesterol at 44 years of age, then have this test every 5 years. Have your cholesterol levels checked more often if:  Your lipid or cholesterol levels are high.  You are older than 44 years of age.  You are at high risk for heart disease. What should I know about cancer screening? Depending on your health history and family history, you may need to have cancer screening at various ages. This may include screening for:  Breast cancer.  Cervical cancer.  Colorectal cancer.  Skin cancer.  Lung cancer. What should I know about heart disease, diabetes, and high blood  pressure? Blood pressure and heart disease  High blood pressure causes heart disease and increases the risk of stroke. This is more likely to develop in people who have high blood pressure readings, are of African descent, or are overweight.  Have your blood pressure checked: ? Every 3-5 years if you are 18-39 years of age. ? Every year if you are 40 years old or older. Diabetes Have regular diabetes screenings. This checks your fasting blood sugar level. Have the screening done:  Once every three years after age 40 if you are at a normal weight and have a low risk for diabetes.  More often and at a younger age if you are overweight or have a high risk for diabetes. What should I know about preventing infection? Hepatitis B If you have a higher risk for hepatitis B, you should be screened for this virus. Talk with your health care provider to find out if you are at risk for hepatitis B infection. Hepatitis C Testing is recommended for:  Everyone born from 1945 through 1965.  Anyone with known risk factors for hepatitis C. Sexually transmitted infections (STIs)  Get screened for STIs, including gonorrhea and chlamydia, if: ? You are sexually active and are younger than 44 years of age. ? You are older than 44 years of age and your health care provider tells you that you are at risk for this type of infection. ? Your sexual activity has changed since you were last screened, and you are at increased risk for chlamydia or gonorrhea. Ask your health care provider if   you are at risk.  Ask your health care provider about whether you are at high risk for HIV. Your health care provider may recommend a prescription medicine to help prevent HIV infection. If you choose to take medicine to prevent HIV, you should first get tested for HIV. You should then be tested every 3 months for as long as you are taking the medicine. Pregnancy  If you are about to stop having your period (premenopausal) and  you may become pregnant, seek counseling before you get pregnant.  Take 400 to 800 micrograms (mcg) of folic acid every day if you become pregnant.  Ask for birth control (contraception) if you want to prevent pregnancy. Osteoporosis and menopause Osteoporosis is a disease in which the bones lose minerals and strength with aging. This can result in bone fractures. If you are 65 years old or older, or if you are at risk for osteoporosis and fractures, ask your health care provider if you should:  Be screened for bone loss.  Take a calcium or vitamin D supplement to lower your risk of fractures.  Be given hormone replacement therapy (HRT) to treat symptoms of menopause. Follow these instructions at home: Lifestyle  Do not use any products that contain nicotine or tobacco, such as cigarettes, e-cigarettes, and chewing tobacco. If you need help quitting, ask your health care provider.  Do not use street drugs.  Do not share needles.  Ask your health care provider for help if you need support or information about quitting drugs. Alcohol use  Do not drink alcohol if: ? Your health care provider tells you not to drink. ? You are pregnant, may be pregnant, or are planning to become pregnant.  If you drink alcohol: ? Limit how much you use to 0-1 drink a day. ? Limit intake if you are breastfeeding.  Be aware of how much alcohol is in your drink. In the [place] one drink equals one 12 oz bottle of beer (355 mL), one 5 oz glass of wine (148 mL), or one 1 oz glass of hard liquor (44 mL). General instructions  Schedule regular health, dental, and eye exams.  Stay current with your vaccines.  Tell your health care provider if: ? You often feel depressed. ? You have ever been abused or do not feel safe at home. Summary  Adopting a healthy lifestyle and getting preventive care are important in promoting health and wellness.  Follow your health care provider's instructions about healthy  diet, exercising, and getting tested or screened for diseases.  Follow your health care provider's instructions on monitoring your cholesterol and blood pressure. This information is not intended to replace advice given to you by your health care provider. Make sure you discuss any questions you have with your health care provider. Document Revised: 06/19/2018 Document Reviewed: 06/19/2018 [employer]vier Patient Education  2020 [employer]  

## 2019-10-20 NOTE — Progress Notes (Signed)
This visit occurred during the SARS-CoV-2 public health emergency.  Safety protocols were in place, including screening questions prior to the visit, additional usage of staff PPE, and extensive cleaning of exam room while observing appropriate contact time as indicated for disinfecting solutions.  Subjective:     Patient ID: Kimberly Maldonado , female    DOB: 04-19-1976 , 44 y.o.   MRN: [id number]   Chief Complaint  Patient presents with  . Annual Exam    HPI  She is here today for a full physical examination. She is followed by Earnstine Regal, PA for her GYN exams. She has no specific concerns or complaints at this time.     Past Medical History:  Diagnosis Date  . Allergy    takes Zyrtec and Singulair daily  . Anxiety   . Breast cancer ([place]) 11/15/2016   right breast  . Carpal tunnel syndrome   . Depression    takes Cymbalta daily  . Genetic testing 12/13/2016   Ms. Klar underwent genetic counseling and testing for hereditary cancer syndromes on 11/23/2016.Testing was performed through a custom panel that combined [employer]'s Common Hereditary Cancers Panel with [employer]'s Thyroid Cancer Panel. This custom panel includes analysis of the following 48 genes: APC, ATM, AXIN2, BARD1, BMPR1A, BRCA1, BRCA2, BRIP1, CDH1, CDKN2A, CHEK2, CTNNA1, DICER1, EPCAM, GREM1  . Insomnia    takes Trazodone and Ambien nightly  . Migraines      Family History  Problem Relation Age of Onset  . COPD Maternal Grandmother   . Hypertension Maternal Grandfather   . Prostate cancer Maternal Grandfather 87  . Bone cancer Maternal Grandfather 1  . Diabetes Mother   . Breast cancer Maternal Aunt 42  . Thyroid cancer Other 40       d.40s     Current Outpatient Medications:  .  cetirizine (ZYRTEC) 10 MG tablet, Take 10 mg by mouth daily as needed for allergies., Disp: , Rfl:  .  exemestane (AROMASIN) 25 MG tablet, exemestane 25 mg tablet, Disp: , Rfl:  .  goserelin (ZOLADEX) 10.8 MG injection, Inject 10.8  mg into the skin once., Disp: , Rfl:  .  ibuprofen (ADVIL,MOTRIN) 600 MG tablet, Take 1 tablet (600 mg total) by mouth every 6 (six) hours as needed., Disp: 40 tablet, Rfl: 1 .  pseudoephedrine-guaifenesin (MUCINEX D) 60-600 MG 12 hr tablet, Take 1 tablet by mouth 2 (two) times daily as needed for congestion., Disp: , Rfl:  .  traZODone (DESYREL) 50 MG tablet, Take 1 tablet (50 mg total) by mouth at bedtime., Disp: 90 tablet, Rfl: 3 .  tretinoin (RETIN-A) 0.1 % cream, tretinoin 0.1 % topical cream, Disp: , Rfl:  .  valACYclovir (VALTREX) 500 MG tablet, TAKE 1 TABLET BY ORAL ROUTE EVERY DAY AS NEEDED, Disp: 30 tablet, Rfl: 0 .  venlafaxine XR (EFFEXOR-XR) 37.5 MG 24 hr capsule, Take 3 capsules (112.5 mg total) by mouth daily with breakfast., Disp: 270 capsule, Rfl: 3 .  zolpidem (AMBIEN) 5 MG tablet, Take 1 tablet (5 mg total) by mouth at bedtime., Disp: 30 tablet, Rfl: 5 .  abemaciclib (VERZENIO) 50 MG tablet, Take 50 mg by mouth 2 (two) times daily., Disp: , Rfl:  .  folic acid (FOLVITE) 1 MG tablet, Take 1 mg by mouth daily., Disp: , Rfl:  .  NON FORMULARY, Take by mouth. Chemo, Disp: , Rfl:  .  vitamin B-12 (CYANOCOBALAMIN) 1000 MCG tablet, Take 2,000 mcg by mouth daily., Disp: , Rfl:    No  Known Allergies    The patient states she uses none for birth control. Last LMP was No LMP recorded. (Menstrual status: Chemotherapy).. Negative for Dysmenorrhea Negative for: breast discharge, breast lump(s), breast pain and breast self exam. Associated symptoms include abnormal vaginal bleeding. Pertinent negatives include abnormal bleeding (hematology), anxiety, decreased libido, depression, difficulty falling sleep, dyspareunia, history of infertility, nocturia, sexual dysfunction, sleep disturbances, urinary incontinence, urinary urgency, vaginal discharge and vaginal itching. Diet regular.The patient states her exercise level is    . The patient's tobacco use is:  Social History   Tobacco Use   Smoking Status Never Smoker  Smokeless Tobacco Never Used  Tobacco Comment   quit smoking 21 yrs ago  . She has been exposed to passive smoke. The patient's alcohol use is:  Social History   Substance and Sexual Activity  Alcohol Use Yes    Review of Systems  Constitutional: Negative.   HENT: Negative.   Eyes: Negative.   Respiratory: Negative.   Cardiovascular: Negative.   Endocrine: Negative.   Genitourinary: Negative.   Musculoskeletal: Negative.   Skin: Negative.   Allergic/Immunologic: Negative.   Neurological: Negative.   Hematological: Negative.   Psychiatric/Behavioral: Negative.      Today's Vitals   10/20/19 1412  BP: 120/64  Pulse: 86  Temp: 98.3 F (36.8 C)  TempSrc: Oral  Weight: 189 lb 9.6 oz (86 kg)  Height: 5' 7"  (1.702 m)   Body mass index is 29.7 kg/m.   Objective:  Physical Exam Vitals and nursing note reviewed.  Constitutional:      Appearance: Normal appearance.  HENT:     Head: Normocephalic and atraumatic.     Right Ear: Tympanic membrane, ear canal and external ear normal.     Left Ear: Tympanic membrane, ear canal and external ear normal.     Nose:     Comments: Deferred, masked    Mouth/Throat:     Comments: Deferred, masked Eyes:     Extraocular Movements: Extraocular movements intact.     Conjunctiva/sclera: Conjunctivae normal.     Pupils: Pupils are equal, round, and reactive to light.  Cardiovascular:     Rate and Rhythm: Normal rate and regular rhythm.     Pulses: Normal pulses.     Heart sounds: Normal heart sounds.  Pulmonary:     Effort: Pulmonary effort is normal.     Breath sounds: Normal breath sounds.  Chest:     Breasts:        Right: Absent.        Left: Absent.     Comments: She has bilateral mastectomy.  Abdominal:     General: Abdomen is flat. Bowel sounds are normal.     Palpations: Abdomen is soft.  Genitourinary:    Comments: deferred Musculoskeletal:        General: Normal range of motion.      Cervical back: Normal range of motion and neck supple.  Skin:    General: Skin is warm and dry.  Neurological:     General: No focal deficit present.     Mental Status: She is alert and oriented to person, place, and time.  Psychiatric:        Mood and Affect: Mood normal.        Behavior: Behavior normal.         Assessment And Plan:     1. Routine general medical examination at health care facility  A full exam was performed.  She has already  had baseline colonoscopy.  PATIENT IS ADVISED TO GET 30-45 MINUTES REGULAR EXERCISE NO LESS THAN FOUR TO FIVE DAYS PER WEEK - BOTH WEIGHTBEARING EXERCISES AND AEROBIC ARE RECOMMENDED.  SHE IS ADVISED TO FOLLOW A HEALTHY DIET WITH AT LEAST SIX FRUITS/VEGGIES PER DAY, DECREASE INTAKE OF RED MEAT, AND TO INCREASE FISH INTAKE TO TWO DAYS PER WEEK.  MEATS/FISH SHOULD NOT BE FRIED, BAKED OR BROILED IS PREFERABLE.  I SUGGEST WEARING SPF 50 SUNSCREEN ON EXPOSED PARTS AND ESPECIALLY WHEN IN THE DIRECT SUNLIGHT FOR AN EXTENDED PERIOD OF TIME.  PLEASE AVOID FAST FOOD RESTAURANTS AND INCREASE YOUR WATER INTAKE.  - Lipid panel - Vitamin D (25 hydroxy) - TSH  2. Malignant neoplasm of upper-outer quadrant of right breast in female, estrogen receptor positive [facility])  She is followed by [facility]. She recently completed clinical trial. Still on neoadjuvant therapies. She is scheduled for consultation for reconstructive surgery later this year.   3. Nodule of parotid gland  Previously followed by Dr. Erik Obey who has since retired. She is agreeable to ENT referral.   - Ambulatory referral to ENT  4. Vitamin D deficiency disease  I WILL CHECK A VIT D LEVEL AND SUPPLEMENT AS NEEDED.  ALSO ENCOURAGED TO SPEND 15 MINUTES IN THE SUN DAILY.   Maximino Greenland, MD    THE PATIENT IS ENCOURAGED TO PRACTICE SOCIAL DISTANCING DUE TO THE COVID-19 PANDEMIC.

## 2019-10-21 LAB — LIPID PANEL
Chol/HDL Ratio: 1.8 ratio (ref 0.0–4.4)
Cholesterol, Total: 122 mg/dL (ref 100–199)
HDL: 67 mg/dL (ref >39)
LDL Chol Calc (NIH): 44 mg/dL (ref 0–99)
Triglycerides: 47 mg/dL (ref 0–149)
VLDL Cholesterol Cal: 11 mg/dL (ref 5–40)

## 2019-10-21 LAB — TSH: TSH: 1.16 u[IU]/mL (ref 0.450–4.500)

## 2019-10-21 LAB — VITAMIN D 25 HYDROXY (VIT D DEFICIENCY, FRACTURES): Vit D, 25-Hydroxy: 32.8 ng/mL (ref 30.0–100.0)

## 2019-10-29 ENCOUNTER — Encounter: Payer: Self-pay | Admitting: Internal Medicine

## 2019-11-05 ENCOUNTER — Encounter: Payer: Self-pay | Admitting: Internal Medicine

## 2019-11-25 ENCOUNTER — Ambulatory Visit: Payer: Self-pay

## 2019-12-01 ENCOUNTER — Other Ambulatory Visit: Payer: Self-pay | Admitting: Otolaryngology

## 2019-12-01 DIAGNOSIS — K118 Other diseases of salivary glands: Secondary | ICD-10-CM

## 2019-12-01 DIAGNOSIS — D37030 Neoplasm of uncertain behavior of the parotid salivary glands: Secondary | ICD-10-CM

## 2019-12-04 LAB — HM DEXA SCAN: HM Dexa Scan: NORMAL

## 2019-12-19 ENCOUNTER — Encounter (HOSPITAL_BASED_OUTPATIENT_CLINIC_OR_DEPARTMENT_OTHER): Payer: Self-pay | Admitting: Otolaryngology

## 2019-12-19 ENCOUNTER — Ambulatory Visit
Admission: RE | Admit: 2019-12-19 | Discharge: 2019-12-19 | Disposition: A | Discharge status: Home / Self Care | Payer: 59 | Source: Ambulatory Visit | Attending: Otolaryngology | Admitting: Otolaryngology

## 2019-12-19 ENCOUNTER — Other Ambulatory Visit: Payer: Self-pay

## 2019-12-19 DIAGNOSIS — K118 Other diseases of salivary glands: Secondary | ICD-10-CM

## 2019-12-19 DIAGNOSIS — D37030 Neoplasm of uncertain behavior of the parotid salivary glands: Secondary | ICD-10-CM

## 2019-12-19 MED ORDER — IOPAMIDOL (ISOVUE-300) INJECTION 61%
75.0000 mL | Freq: Once | INTRAVENOUS | Status: AC | PRN
  Administered 2019-12-19: 75 mL via INTRAVENOUS

## 2019-12-26 ENCOUNTER — Encounter: Payer: Self-pay | Admitting: Internal Medicine

## 2019-12-28 ENCOUNTER — Other Ambulatory Visit: Payer: Self-pay

## 2019-12-28 ENCOUNTER — Encounter (HOSPITAL_COMMUNITY): Payer: Self-pay

## 2019-12-28 ENCOUNTER — Emergency Department (HOSPITAL_COMMUNITY): Payer: 59

## 2019-12-28 ENCOUNTER — Emergency Department (HOSPITAL_COMMUNITY)
Admission: EM | Admit: 2019-12-28 | Discharge: 2019-12-28 | Disposition: A | Discharge status: Home / Self Care | Payer: 59 | Attending: Emergency Medicine | Admitting: Emergency Medicine

## 2019-12-28 DIAGNOSIS — R079 Chest pain, unspecified: Secondary | ICD-10-CM | POA: Diagnosis not present

## 2019-12-28 DIAGNOSIS — Z5321 Procedure and treatment not carried out due to patient leaving prior to being seen by health care provider: Secondary | ICD-10-CM | POA: Diagnosis not present

## 2019-12-28 DIAGNOSIS — R1110 Vomiting, unspecified: Secondary | ICD-10-CM | POA: Insufficient documentation

## 2019-12-28 LAB — BASIC METABOLIC PANEL
Anion gap: 11 (ref 5–15)
BUN: 17 mg/dL (ref 6–20)
CO2: 21 mmol/L — ABNORMAL LOW (ref 22–32)
Calcium: 9.3 mg/dL (ref 8.9–10.3)
Chloride: 104 mmol/L (ref 98–111)
Creatinine, Ser: 1.01 mg/dL — ABNORMAL HIGH (ref 0.44–1.00)
GFR calc Af Amer: >60 mL/min (ref >60)
GFR calc non Af Amer: >60 mL/min (ref >60)
Glucose, Bld: 106 mg/dL — ABNORMAL HIGH (ref 70–99)
Potassium: 3.8 mmol/L (ref 3.5–5.1)
Sodium: 136 mmol/L (ref 135–145)

## 2019-12-28 LAB — CBC
HCT: 40.1 % (ref 36.0–46.0)
Hemoglobin: 13.5 g/dL (ref 12.0–15.0)
MCH: 31.3 pg (ref 26.0–34.0)
MCHC: 33.7 g/dL (ref 30.0–36.0)
MCV: 93 fL (ref 80.0–100.0)
Platelets: 172 10*3/uL (ref 150–400)
RBC: 4.31 MIL/uL (ref 3.87–5.11)
RDW: 13.4 % (ref 11.5–15.5)
WBC: 2.9 10*3/uL — ABNORMAL LOW (ref 4.0–10.5)
nRBC: 0 % (ref 0.0–0.2)

## 2019-12-28 LAB — TROPONIN I (HIGH SENSITIVITY): Troponin I (High Sensitivity): 6 ng/L (ref <18)

## 2019-12-28 LAB — HCG, SERUM, QUALITATIVE: Preg, Serum: NEGATIVE

## 2019-12-28 MED ORDER — SODIUM CHLORIDE 0.9% FLUSH: 3.0000 mL | Freq: Once | INTRAVENOUS | Status: DC

## 2019-12-28 NOTE — ED Triage Notes (Signed)
Arrived POV from home. Patient reports chest pain that started Friday night. Patient also reports mild nausea without vomiting. NAD

## 2019-12-29 ENCOUNTER — Ambulatory Visit (HOSPITAL_BASED_OUTPATIENT_CLINIC_OR_DEPARTMENT_OTHER): Admission: RE | Admit: 2019-12-29 | Payer: 59 | Source: Home / Self Care | Admitting: Otolaryngology

## 2019-12-29 HISTORY — DX: Kidney donor: Z52.4

## 2019-12-29 SURGERY — EXCISION, PAROTID GLAND
Anesthesia: General | Laterality: Right

## 2020-01-06 LAB — HM PAP SMEAR

## 2020-05-06 ENCOUNTER — Encounter: Payer: Self-pay | Admitting: Internal Medicine

## 2020-05-07 ENCOUNTER — Telehealth: Payer: Self-pay

## 2020-05-07 NOTE — Telephone Encounter (Signed)
The pt requested an appt and agreed to a my chart virtual visit.  The appt was scheduled.

## 2020-05-10 ENCOUNTER — Telehealth: Payer: Self-pay | Admitting: Nurse Practitioner

## 2020-05-10 HISTORY — PX: BREAST RECONSTRUCTION: SHX9

## 2020-05-11 ENCOUNTER — Encounter: Payer: Self-pay | Admitting: Nurse Practitioner

## 2020-05-11 ENCOUNTER — Telehealth (INDEPENDENT_AMBULATORY_CARE_PROVIDER_SITE_OTHER): Payer: 59 | Admitting: Nurse Practitioner

## 2020-05-11 VITALS — Ht 69.0 in | Wt 195.0 lb

## 2020-05-11 DIAGNOSIS — R053 Chronic cough: Secondary | ICD-10-CM | POA: Diagnosis not present

## 2020-05-11 MED ORDER — AZITHROMYCIN 250 MG PO TABS: ORAL_TABLET | ORAL | 0 refills | Status: AC

## 2020-05-11 MED ORDER — FLUCONAZOLE 100 MG PO TABS: 100.0000 mg | ORAL_TABLET | Freq: Every day | ORAL | 0 refills | Status: DC

## 2020-05-11 NOTE — Progress Notes (Signed)
Virtual Visit via MyChart   This visit type was conducted due to national recommendations for restrictions regarding the COVID-19 Pandemic (e.g. social distancing) in an effort to limit this patient's exposure and mitigate transmission in our community.  Due to her co-morbid illnesses, this patient is at least at moderate risk for complications without adequate follow up.  This format is felt to be most appropriate for this patient at this time.  All issues noted in this document were discussed and addressed.  A limited physical exam was performed with this format.    This visit type was conducted due to national recommendations for restrictions regarding the COVID-19 Pandemic (e.g. social distancing) in an effort to limit this patient's exposure and mitigate transmission in our community.  Patients identity confirmed using two different identifiers.  This format is felt to be most appropriate for this patient at this time.  All issues noted in this document were discussed and addressed.  No physical exam was performed (except for noted visual exam findings with Video Visits).    Date:  05/12/2020   ID:  Kimberly Maldonado, DOB June 30, 1976, MRN [id number]  Patient Location:  Car - spoke with Kimberly Maldonado  Provider location:   Office    Chief Complaint:  cough  History of Present Illness:    Kimberly Maldonado is a 44 y.o. female who presents via video conferencing for a telehealth visit today.    The patient does have symptoms concerning for COVID-19 infection (fever, chills, cough, or new shortness of breath).   She is having reconstruction breast on 05/26/2020 denies any allergies.   Cough This is a new problem. The current episode started 1 to 4 weeks ago (two weeks). The problem has been unchanged. The problem occurs every few hours. The cough is productive of sputum (yellow in the morning clears up throughout). Pertinent negatives include no chest pain, ear congestion, fever, myalgias,  shortness of breath or wheezing. Associated symptoms comments: Started out with nasal congestion.  . Exacerbated by: laughing, worse in the morning. Treatments tried: mucinex d. There is no history of asthma.      Past Medical History:  Diagnosis Date  . Allergy    takes Zyrtec and Singulair daily  . Anxiety   . Breast cancer ([place]) 11/15/2016   right breast  . Carpal tunnel syndrome   . Depression    takes Cymbalta daily  . Genetic testing 12/13/2016   Ms. Brazel underwent genetic counseling and testing for hereditary cancer syndromes on 11/23/2016.Testing was performed through a custom panel that combined [employer]'s Common Hereditary Cancers Panel with [employer]'s Thyroid Cancer Panel. This custom panel includes analysis of the following 48 genes: APC, ATM, AXIN2, BARD1, BMPR1A, BRCA1, BRCA2, BRIP1, CDH1, CDKN2A, CHEK2, CTNNA1, DICER1, EPCAM, GREM1  . Insomnia    takes Trazodone and Ambien nightly  . Kidney donor   . Migraines    Past Surgical History:  Procedure Laterality Date  . BREAST BIOPSY Right 11/15/2016   Procedure: RIGHT BREAST EXCISIONAL BIOPSY;  Surgeon: [name], MD;  Location: [place];  Service: General;  Laterality: Right;  . BREAST LUMPECTOMY Right 11/15/2016  . COLONOSCOPY    . KIDNEY DONATION Left 2005  . MASTECTOMY Bilateral 05/03/2017   with 17 lymph nodes removed [facility]     Current Meds  Medication Sig  . cetirizine (ZYRTEC) 10 MG tablet Take 10 mg by mouth daily as needed for allergies.  . Cholecalciferol (VITAMIN D) 50 MCG (2000 UT) CAPS Take 1  capsule by mouth daily.  Marland Kitchen exemestane (AROMASIN) 25 MG tablet exemestane 25 mg tablet  . goserelin (ZOLADEX) 10.8 MG injection Inject 10.8 mg into the skin once.  Marland Kitchen ibuprofen (ADVIL,MOTRIN) 600 MG tablet Take 1 tablet (600 mg total) by mouth every 6 (six) hours as needed.  Marland Kitchen levocetirizine (XYZAL) 5 MG tablet Take 5 mg by mouth every evening.  . pseudoephedrine-guaifenesin (MUCINEX D) 60-600 MG 12 hr tablet Take 1  tablet by mouth 2 (two) times daily as needed for congestion.  . traZODone (DESYREL) 50 MG tablet Take 1 tablet (50 mg total) by mouth at bedtime.  . tretinoin (RETIN-A) 0.1 % cream tretinoin 0.1 % topical cream  . valACYclovir (VALTREX) 500 MG tablet TAKE 1 TABLET BY ORAL ROUTE EVERY DAY AS NEEDED  . venlafaxine XR (EFFEXOR-XR) 37.5 MG 24 hr capsule Take 3 capsules (112.5 mg total) by mouth daily with breakfast.     Allergies:   Patient has no known allergies.   Social History   Tobacco Use  . Smoking status: Former Research scientist (life sciences)  . Smokeless tobacco: Never Used  . Tobacco comment: quit smoking 21 yrs ago  Vaping Use  . Vaping Use: Never used  Substance Use Topics  . Alcohol use: Yes    Comment: 3 glasses per week  . Drug use: No     Family Hx: The patient's family history includes Bone cancer (age of onset: 68) in her maternal grandfather; Breast cancer (age of onset: 2) in her maternal aunt; COPD in her maternal grandmother; Diabetes in her mother; Hypertension in her maternal grandfather; Prostate cancer (age of onset: 59) in her maternal grandfather; Thyroid cancer (age of onset: 35) in an other family member.  ROS:   Please see the history of present illness.    Review of Systems  Constitutional: Negative for fever.  Respiratory: Positive for cough. Negative for shortness of breath and wheezing.   Cardiovascular: Negative for chest pain.  Musculoskeletal: Negative for myalgias.    All other systems reviewed and are negative.   Labs/Other Tests and Data Reviewed:    Recent Labs: 10/20/2019: TSH 1.160 12/28/2019: BUN 17; Creatinine, Ser 1.01; Hemoglobin 13.5; Platelets 172; Potassium 3.8; Sodium 136   Recent Lipid Panel Lab Results  Component Value Date/Time   CHOL 122 10/20/2019 02:55 PM   TRIG 47 10/20/2019 02:55 PM   HDL 67 10/20/2019 02:55 PM   CHOLHDL 1.8 10/20/2019 02:55 PM   LDLCALC 44 10/20/2019 02:55 PM    Wt Readings from Last 3 Encounters:  05/11/20 195 lb  (88.5 kg)  10/20/19 189 lb 9.6 oz (86 kg)  11/23/16 164 lb 12.8 oz (74.8 kg)     Exam:    Vital Signs:  Ht 5' 9"  (1.753 m)   Wt 195 lb (88.5 kg)   BMI 28.80 kg/m     Physical Exam Vitals reviewed.  Constitutional:      General: She is not in acute distress.    Appearance: Normal appearance.  Pulmonary:     Effort: Pulmonary effort is normal. No respiratory distress.     Comments: She does have a dry cough when she laughs Neurological:     General: No focal deficit present.     Mental Status: She is alert and oriented to person, place, and time.     Cranial Nerves: No cranial nerve deficit.  Psychiatric:        Mood and Affect: Mood normal.        Behavior: Behavior normal.  Thought Content: Thought content normal.        Judgment: Judgment normal.     ASSESSMENT & PLAN:    1. Persistent cough  Will treat with antibiotic as empirical treatment as she is going to have reconstructive surgery in a few weeks  She declined a cough syrup or capsules. - azithromycin (ZITHROMAX) 250 MG tablet; Take 2 tablets (500 mg) on  Day 1,  followed by 1 tablet (250 mg) once daily on Days 2 through 5.  Dispense: 6 each; Refill: 0 - fluconazole (DIFLUCAN) 100 MG tablet; Take 1 tablet (100 mg total) by mouth daily. Take 1 tablet by mouth now repeat in 5 days  Dispense: 2 tablet; Refill: 0   COVID-19 Education: The signs and symptoms of COVID-19 were discussed with the patient and how to seek care for testing (follow up with PCP or arrange E-visit).  The importance of social distancing was discussed today.  Patient Risk:   After full review of this patients clinical status, I feel that they are at least moderate risk at this time.  Time:   Today, I have spent 9 minutes/ seconds with the patient with telehealth technology discussing above diagnoses.     Medication Adjustments/Labs and Tests Ordered: Current medicines are reviewed at length with the patient today.  Concerns  regarding medicines are outlined above.   Tests Ordered: No orders of the defined types were placed in this encounter.   Medication Changes: Meds ordered this encounter  Medications  . azithromycin (ZITHROMAX) 250 MG tablet    Sig: Take 2 tablets (500 mg) on  Day 1,  followed by 1 tablet (250 mg) once daily on Days 2 through 5.    Dispense:  6 each    Refill:  0  . fluconazole (DIFLUCAN) 100 MG tablet    Sig: Take 1 tablet (100 mg total) by mouth daily. Take 1 tablet by mouth now repeat in 5 days    Dispense:  2 tablet    Refill:  0    Disposition:  Follow up prn  Signed, Minette Brine, FNP

## 2020-05-12 ENCOUNTER — Encounter: Payer: Self-pay | Admitting: Nurse Practitioner

## 2020-07-08 ENCOUNTER — Other Ambulatory Visit: Payer: Self-pay | Admitting: Obstetrics and Gynecology

## 2020-07-30 ENCOUNTER — Encounter (HOSPITAL_COMMUNITY): Admission: RE | Admit: 2020-07-30 | Payer: 59 | Source: Ambulatory Visit

## 2020-08-02 NOTE — H&P (Signed)
Kimberly Maldonado is a 45 y.o.  female, P: 1-0-2-1 who presents for hysterectomy and bilateral salpingo-oophorectomy because of  estrogen receptor positive breast cancer.   In  May 2018 the patient was diagnosed with Stage [id number] right breast cancer for which she underwent a bilateral mastectomy in October of that same year.  The patient is on chemotherapy that has placed her in a menopausal state.  She denies any vaginal bleeding, pelvic pain, changes in bowel or bladder function or vaginitis symptoms.  A pelvic ultrasound in November 2019 revealed an anteverted uterus: fundus to external os-7.7 cm (volume = 72.4 cc) 7.69 x 5.64 x 3.19 cm, endometrium: 4 mm with a left sub-serosal fibroid 1.7 cm ; right ovary-2.90 cm and left ovary-3.09 cm.  Given her diagnosis the patient would like to proceed with a hysterectomy and removal of both fallopian tubes and ovaries.   Past Medical History  OB History: G: 3;  P: 1-0-2-1; SVB 2006 weighing 6 lbs. 7 oz.  GYN History: menarche: 45 YO; LMP: 09/2016  (menopausal due to chemotherapy);   Denies history of abnormal PAP smear.   Last PAP smear: 2021-normal with negative HPV  Medical History: Right Breast Cancer, Migraine, Insomnia, Depression, Anxiety, Left Kidney Donor, HSV-1, Central Centrifugal Scarring Alopecia and Vitamin D Deficiency.  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; 2018 Bilateral Mastectomy and 2021 Bilateral Breast Reconstruction Denies problems with anesthesia or history of blood transfusions  Family History: Cancers: Breast, Prostate, Thyroid and Bone; Hypertension, COPD and Diabetes Mellitus  Social History: Divorced and employed in Press photographer;  Former Smoker and occasional alcohol   Medications:  Exemestane 25 mg daily Ibuprofen 600 mg prn Trazodone 50 mg qhs Valacyclovir 500 mg prn Venlafaxine  ER  37.5 mg daily with breakfast Zoladex 10.8 mg Sub-Cutaneous Implant   No Known Allergies  Denies sensitivity to peanuts, shellfish,  soy, latex or adhesives.  ROS: Admits to reading glasses, but  denies headache, vision changes, nasal congestion, dysphagia, tinnitus, dizziness, hoarseness, cough,  chest pain, shortness of breath, nausea, vomiting, diarrhea,constipation,  urinary frequency, urgency  dysuria, hematuria, vaginitis symptoms, pelvic pain, swelling of joints,easy bruising,  myalgias, arthralgias, skin rashes, unexplained weight loss and except as is mentioned in the history of present illness, patient's review of systems is otherwise negative.    Physical Exam  Bp: 110/70;  P: 77 bpm;  R: 16;  Temperature: 98.8 degrees F orally;  Weight: 198 lbs.;  Height: 5'9";  BMI: 29.2  Neck: supple without masses or thyromegaly Lungs: clear to auscultation Heart: regular rate and rhythm Abdomen: soft, non-tender and no organomegaly Pelvic:EGBUS- wnl; vagina-normal rugae; uterus-normal size, cervix without lesions or motion tenderness; adnexae-no tenderness or masses Extremities:  no clubbing, cyanosis or edema   Assesment: Estrogen Receptor Positive Breast Cancer   Disposition:  Robotic-assisted hysterectomy was reviewed with the patient.  Benefits of the robotic approach include lesser postoperative pain, less blood loss during surgery, reduced risk of injury to other organs due to better visualization with a 3-D HD 10 times magnifying camera, shorter hospital stay between 0-1 night and rapid recovery with return to daily routine in 2-3 weeks. Although robotic-assisted hysterectomy has a longer operative time than traditional laparotomy, in a patient with good medical history, the benefits usually outweigh the risks.   The indication for her surgery was reviewed, along with its risks, to include but not limited to:  reaction to anesthesia, bleeding, infection, injury to other organs, need for laparotomy, transient post-operative facial edema and  increased risk of pelvic prolapse associated with any hysterectomy.   The  patient verbalized understanding of these risks  and she has consented to proceed with a Robotic Assisted Total Laparoscopic Hysterectomy with Bilateral Salpingo-oophorectomy at [facility] on August 12, 2020 @ 7:30 a.m.  CSN# [id number]   Retha Bither J. Florene Glen, PA-C  for Dr. Dede Query.Rivard

## 2020-08-04 ENCOUNTER — Other Ambulatory Visit: Payer: Self-pay

## 2020-08-04 ENCOUNTER — Other Ambulatory Visit: Payer: Self-pay | Admitting: Psychiatry

## 2020-08-04 ENCOUNTER — Encounter (HOSPITAL_BASED_OUTPATIENT_CLINIC_OR_DEPARTMENT_OTHER): Payer: Self-pay | Admitting: Obstetrics and Gynecology

## 2020-08-04 DIAGNOSIS — F411 Generalized anxiety disorder: Secondary | ICD-10-CM

## 2020-08-04 DIAGNOSIS — F331 Major depressive disorder, recurrent, moderate: Secondary | ICD-10-CM

## 2020-08-04 DIAGNOSIS — F5105 Insomnia due to other mental disorder: Secondary | ICD-10-CM

## 2020-08-04 NOTE — Progress Notes (Addendum)
Spoke w/ via phone for pre-op interview---pt Lab needs dos---- urine preg              Lab results------has lab appt 08-09-2020 1000 am, chest xray and ekg done 12-28-2019 epic COVID test -----08-09-2020 1100- Arrive at -------530 am 08-12-2020 NPO after MN NO Solid Food.  Clear liquids from MN until---430 am drink ensure presurgery drink at 430 am then npo then npo Medications to take morning of surgery -----venlafaxine, certrizine, aromasin Diabetic medication -----n/a Patient Special Instructions -----none Pre-Op special Istructions -----none Patient verbalized understanding of instructions that were given at this phone interview. Patient denies shortness of breath, chest pain, fever, cough at this phone interview.

## 2020-08-04 NOTE — Progress Notes (Signed)
YOU ARE SCHEDULED FOR A COVID TEST 1-31-2022_@ 1100 am_. THIS TEST MUST BE DONE BEFORE SURGERY. GO TO  [place]. [place], [place], IT IS APPROXIMATELY 2 MINUTES PAST [employer] ON THE RIGHT AND REMAIN IN YOUR CAR, THIS IS A DRIVE UP TEST. ONCE YOUR COVID TEST IS DONE PLEASE FOLLOW ALL THE QUARANTINE  INSTRUCTIONS GIVEN IN YOUR HANDOUT.      Your procedure is scheduled on 08-12-2020  Report to [facility].   Call this number if you have problems the morning of surgery  :[phone].   OUR ADDRESS IS [address].  WE ARE LOCATED IN [facility].  PLEASE BRING YOUR INSURANCE CARD AND PHOTO ID DAY OF SURGERY.  ONLY ONE PERSON ALLOWED IN FACILITY WAITING AREA.                                     REMEMBER:  DO NOT EAT FOOD, CANDY GUM OR MINTS  AFTER MIDNIGHT . YOU MAY HAVE CLEAR LIQUIDS FROM MIDNIGHT UNTIL 430 AM. NO CLEAR LIQUIDS AFTER  430 AM DAY OF SURGERY.NO SOLID FOOD AFTER MIDNIGHT THE NIGHT PRIOR TO SURGERY. NOTHING BY MOUTH EXCEPT CLEAR LIQUIDS UNTIL 430 AM . PLEASE FINISH ENSURE DRINK PER SURGEON ORDER  WHICH NEEDS TO BE COMPLETED AT 430 AM .   YOU MAY  BRUSH YOUR TEETH MORNING OF SURGERY AND RINSE YOUR MOUTH OUT, NO CHEWING GUM CANDY OR MINTS.    CLEAR LIQUID DIET   Foods Allowed                                                                     Foods Excluded  Coffee and tea, regular and decaf                             liquids that you cannot  Plain Jell-O any favor except red or purple                                           see through such as: Fruit ices (not with fruit pulp)                                     milk, soups, orange juice  Iced Popsicles                                    All solid food Carbonated beverages, regular and diet                                    Cranberry, grape and apple juices Sports drinks like Gatorade Lightly seasoned clear broth or consume(fat free) Sugar, honey  syrup  Sample Menu Breakfast  Lunch                                     Supper Cranberry juice                    Beef broth                            Chicken broth Jell-O                                     Grape juice                           Apple juice Coffee or tea                        Jell-O                                      Popsicle                                                Coffee or tea                        Coffee or tea  _____________________________________________________________________     TAKE THESE MEDICATIONS MORNING OF SURGERY WITH A SIP OF WATER:  VENLAFAXINE, CERTRIZINE, AROMASIN  ONE VISITOR IS ALLOWED IN WAITING ROOM ONLY DAY OF SURGERY.  NO VISITOR MAY SPEND THE NIGHT.  VISITOR ARE ALLOWED TO STAY UNTIL 800 PM.                                    DO NOT WEAR JEWERLY, MAKE UP, OR NAIL POLISH ON FINGERNAILS. DO NOT WEAR LOTIONS, POWDERS, PERFUMES OR DEODORANT. DO NOT SHAVE FOR 24 HOURS PRIOR TO DAY OF SURGERY. MEN MAY SHAVE FACE AND NECK. CONTACTS, GLASSES, OR DENTURES MAY NOT BE WORN TO SURGERY.                                    [place] [facility] IS NOT RESPONSIBLE  FOR ANY BELONGINGS.                                                                    Marland Kitchen           [place] - Preparing for Surgery Before surgery, you can play an important role.  Because skin is not sterile, your skin needs to be as free of germs as possible.  You can reduce the number of germs on your skin by washing with CHG (chlorahexidine gluconate) soap before  surgery.  CHG is an antiseptic cleaner which kills germs and bonds with the skin to continue killing germs even after washing. Please DO NOT use if you have an allergy to CHG or antibacterial soaps.  If your skin becomes reddened/irritated stop using the CHG and inform your nurse when you arrive at Short Stay. Do not shave (including legs and underarms) for at least 48 hours prior to the first CHG  shower.  You may shave your face/neck. Please follow these instructions carefully:  1.  Shower with CHG Soap the night before surgery and the  morning of Surgery.  2.  If you choose to wash your hair, wash your hair first as usual with your  normal  shampoo.  3.  After you shampoo, rinse your hair and body thoroughly to remove the  shampoo.                           4.  Use CHG as you would any other liquid soap.  You can apply chg directly  to the skin and wash                       Gently with a scrungie or clean washcloth.  5.  Apply the CHG Soap to your body ONLY FROM THE NECK DOWN.   Do not use on face/ open                           Wound or open sores. Avoid contact with eyes, ears mouth and genitals (private parts).                       Wash face,  Genitals (private parts) with your normal soap.             6.  Wash thoroughly, paying special attention to the area where your surgery  will be performed.  7.  Thoroughly rinse your body with warm water from the neck down.  8.  DO NOT shower/wash with your normal soap after using and rinsing off  the CHG Soap.                9.  Pat yourself dry with a clean towel.            10.  Wear clean pajamas.            11.  Place clean sheets on your bed the night of your first shower and do not  sleep with pets. Day of Surgery : Do not apply any lotions/deodorants the morning of surgery.  Please wear clean clothes to the hospital/surgery center.  FAILURE TO FOLLOW THESE INSTRUCTIONS MAY RESULT IN THE CANCELLATION OF YOUR SURGERY PATIENT SIGNATURE_________________________________  NURSE SIGNATURE__________________________________  ________________________________________________________________________                                                          Questions call Tacy Chavis pre op nurse [phone]

## 2020-08-09 ENCOUNTER — Other Ambulatory Visit (HOSPITAL_COMMUNITY)
Admission: RE | Admit: 2020-08-09 | Discharge: 2020-08-09 | Disposition: A | Discharge status: Home / Self Care | Payer: 59 | Source: Ambulatory Visit | Attending: Obstetrics and Gynecology | Admitting: Obstetrics and Gynecology

## 2020-08-09 ENCOUNTER — Encounter (HOSPITAL_COMMUNITY)
Admission: RE | Admit: 2020-08-09 | Discharge: 2020-08-09 | Disposition: A | Discharge status: Home / Self Care | Payer: 59 | Source: Ambulatory Visit | Attending: Obstetrics and Gynecology | Admitting: Obstetrics and Gynecology

## 2020-08-09 ENCOUNTER — Other Ambulatory Visit: Payer: Self-pay

## 2020-08-09 DIAGNOSIS — Z20822 Contact with and (suspected) exposure to covid-19: Secondary | ICD-10-CM | POA: Insufficient documentation

## 2020-08-09 DIAGNOSIS — Z01812 Encounter for preprocedural laboratory examination: Secondary | ICD-10-CM | POA: Diagnosis not present

## 2020-08-09 DIAGNOSIS — U071 COVID-19: Secondary | ICD-10-CM

## 2020-08-09 HISTORY — DX: COVID-19: U07.1

## 2020-08-09 LAB — CBC
HCT: 41.5 % (ref 36.0–46.0)
Hemoglobin: 13.3 g/dL (ref 12.0–15.0)
MCH: 29.6 pg (ref 26.0–34.0)
MCHC: 32 g/dL (ref 30.0–36.0)
MCV: 92.4 fL (ref 80.0–100.0)
Platelets: 203 10*3/uL (ref 150–400)
RBC: 4.49 MIL/uL (ref 3.87–5.11)
RDW: 14.8 % (ref 11.5–15.5)
WBC: 3.2 10*3/uL — ABNORMAL LOW (ref 4.0–10.5)
nRBC: 0 % (ref 0.0–0.2)

## 2020-08-09 LAB — BASIC METABOLIC PANEL
Anion gap: 9 (ref 5–15)
BUN: 15 mg/dL (ref 6–20)
CO2: 24 mmol/L (ref 22–32)
Calcium: 9.9 mg/dL (ref 8.9–10.3)
Chloride: 106 mmol/L (ref 98–111)
Creatinine, Ser: 0.98 mg/dL (ref 0.44–1.00)
GFR, Estimated: >60 mL/min (ref >60)
Glucose, Bld: 99 mg/dL (ref 70–99)
Potassium: 4.1 mmol/L (ref 3.5–5.1)
Sodium: 139 mmol/L (ref 135–145)

## 2020-08-09 LAB — SARS CORONAVIRUS 2 (TAT 6-24 HRS): SARS Coronavirus 2: POSITIVE — AB

## 2020-08-10 ENCOUNTER — Telehealth (HOSPITAL_COMMUNITY): Payer: Self-pay | Admitting: Adult Health

## 2020-08-10 NOTE — Progress Notes (Signed)
Called and left message for OR scheduler for Dr Cletis Media, Erline Levine, informed her of pt positive covid test and will need to be rescheduled. Dr Cletis Media and / or office should be getting notified  by covid testing site today.

## 2020-08-10 NOTE — Telephone Encounter (Signed)
Called to discuss with patient about COVID-19 symptoms and the use of one of the available treatments for those with mild to moderate Covid symptoms and at a high risk of hospitalization.  Pt appears to qualify for outpatient treatment due to co-morbid conditions and/or a member of an at-risk group in accordance with the FDA Emergency Use Authorization.   Patient is asymptomatic and notes that she was around her family christmas and everyone had tested positive for COVID19 at that time.  She does not meet criteria for treatment.  Scot Dock

## 2020-08-12 LAB — TYPE AND SCREEN
ABO/RH(D): O POS
Antibody Screen: NEGATIVE

## 2020-08-26 ENCOUNTER — Encounter: Payer: Self-pay | Admitting: Internal Medicine

## 2020-08-27 ENCOUNTER — Other Ambulatory Visit: Payer: Self-pay | Admitting: Psychiatry

## 2020-08-27 DIAGNOSIS — F411 Generalized anxiety disorder: Secondary | ICD-10-CM

## 2020-08-27 DIAGNOSIS — F5105 Insomnia due to other mental disorder: Secondary | ICD-10-CM

## 2020-08-27 DIAGNOSIS — F331 Major depressive disorder, recurrent, moderate: Secondary | ICD-10-CM

## 2020-08-30 ENCOUNTER — Other Ambulatory Visit (HOSPITAL_COMMUNITY): Payer: 59

## 2020-09-01 ENCOUNTER — Other Ambulatory Visit: Payer: Self-pay

## 2020-09-01 ENCOUNTER — Encounter (HOSPITAL_BASED_OUTPATIENT_CLINIC_OR_DEPARTMENT_OTHER): Payer: Self-pay | Admitting: Obstetrics and Gynecology

## 2020-09-01 ENCOUNTER — Other Ambulatory Visit: Payer: Self-pay | Admitting: Psychiatry

## 2020-09-01 DIAGNOSIS — F331 Major depressive disorder, recurrent, moderate: Secondary | ICD-10-CM

## 2020-09-01 DIAGNOSIS — F411 Generalized anxiety disorder: Secondary | ICD-10-CM

## 2020-09-01 NOTE — Progress Notes (Signed)
Spoke w/ via phone for pre-op interview---pt Lab needs dos---- urine preg              Lab results------has lab appt  09-07-2020 for cbc bmp t & s am, chest xray and ekg done 12-28-2019 epic COVID test -----positive covid 08-09-2020 epic Arrive at -------57 am  3-3--2022 NPO after MN NO Solid Food.  Clear liquids from MN until---430 am drink ensure presurgery drink at 430 am then npo then npo Medications to take morning of surgery -----venlafaxine, certrizine, aromasin Diabetic medication -----n/a Patient Special Instructions -----none Pre-Op special Istructions -----none Patient verbalized understanding of instructions that were given at this phone interview. Patient denies shortness of breath, chest pain, fever, cough at this phone interview.

## 2020-09-01 NOTE — Telephone Encounter (Signed)
Call to RS. I will send in a prescription refill once she has an appointment scheduled.  She is way overdue for an appointment.

## 2020-09-01 NOTE — Progress Notes (Signed)
Your procedure is scheduled on 09-09-2020  Report to [facility].   Call this number if you have problems the morning of surgery  :[phone].   OUR ADDRESS IS [place].  WE [facility]ATED [facility].  PLEASE BRING YOUR INSURANCE CARD AND PHOTO ID DAY OF SURGERY.  ONLY ONE PERSON ALLOWED IN FACILITY WAITING AREA.                                     REMEMBER:  DO NOT EAT FOOD, CANDY GUM OR MINTS  AFTER MIDNIGHT . YOU MAY HAVE CLEAR LIQUIDS FROM MIDNIGHT UNTIL 430 AM. NO CLEAR LIQUIDS AFTER  430 AM DAY OF SURGERY.NO SOLID FOOD AFTER MIDNIGHT THE NIGHT PRIOR TO SURGERY. NOTHING BY MOUTH EXCEPT CLEAR LIQUIDS UNTIL 430 AM . PLEASE FINISH ENSURE DRINK PER SURGEON ORDER  WHICH NEEDS TO BE COMPLETED AT 430 AM .   YOU MAY  BRUSH YOUR TEETH MORNING OF SURGERY AND RINSE YOUR MOUTH OUT, NO CHEWING GUM CANDY OR MINTS.    CLEAR LIQUID DIET   Foods Allowed                                                                     Foods Excluded  Coffee and tea, regular and decaf                             liquids that you cannot  Plain Jell-O any favor except red or purple                                           see through such as: Fruit ices (not with fruit pulp)                                     milk, soups, orange juice  Iced Popsicles                                    All solid food Carbonated beverages, regular and diet                                    Cranberry, grape and apple juices Sports drinks like Gatorade Lightly seasoned clear broth or consume(fat free) Sugar, honey syrup  Sample Menu Breakfast                                Lunch                                     Supper  Cranberry juice                    Beef broth                            Chicken broth Jell-O                                     Grape juice                           Apple juice Coffee or tea                        Jell-O                                       Popsicle                                                Coffee or tea                        Coffee or tea  _____________________________________________________________________     TAKE THESE MEDICATIONS MORNING OF SURGERY WITH A SIP OF WATER:  VENLAFAXINE, CERTRIZINE, AROMASIN  ONE VISITOR IS ALLOWED IN WAITING ROOM ONLY DAY OF SURGERY.  NO VISITOR MAY SPEND THE NIGHT.  VISITOR ARE ALLOWED TO STAY UNTIL 800 PM.                                    DO NOT WEAR JEWERLY, MAKE UP, OR NAIL POLISH ON FINGERNAILS. DO NOT WEAR LOTIONS, POWDERS, PERFUMES OR DEODORANT. DO NOT SHAVE FOR 24 HOURS PRIOR TO DAY OF SURGERY. MEN MAY SHAVE FACE AND NECK. CONTACTS, GLASSES, OR DENTURES MAY NOT BE WORN TO SURGERY.                                    [facility] IS NOT RESPONSIBLE  FOR ANY BELONGINGS.                                                                    Marland Kitchen           [place] - Preparing for Surgery Before surgery, you can play an important role.  Because skin is not sterile, your skin needs to be as free of germs as possible.  You can reduce the number of germs on your skin by washing with CHG (chlorahexidine gluconate) soap before surgery.  CHG is an antiseptic cleaner which kills germs and bonds with the skin to continue killing germs even after washing. Please DO NOT use if you have an allergy to CHG or antibacterial soaps.  If your skin becomes reddened/irritated stop using the CHG and inform your nurse when you arrive at Short Stay. Do not shave (including legs and underarms) for at least 48 hours prior to the first CHG shower.  You may shave your face/neck. Please follow these instructions carefully:  1.  Shower with CHG Soap the night before surgery and the  morning of Surgery.  2.  If you choose to wash your hair, wash your hair first as usual with your  normal  shampoo.  3.  After you shampoo, rinse your hair and body thoroughly to remove the  shampoo.                            4.  Use CHG as you would any other liquid soap.  You can apply chg directly  to the skin and wash                       Gently with a scrungie or clean washcloth.  5.  Apply the CHG Soap to your body ONLY FROM THE NECK DOWN.   Do not use on face/ open                           Wound or open sores. Avoid contact with eyes, ears mouth and genitals (private parts).                       Wash face,  Genitals (private parts) with your normal soap.             6.  Wash thoroughly, paying special attention to the area where your surgery  will be performed.  7.  Thoroughly rinse your body with warm water from the neck down.  8.  DO NOT shower/wash with your normal soap after using and rinsing off  the CHG Soap.                9.  Pat yourself dry with a clean towel.            10.  Wear clean pajamas.            11.  Place clean sheets on your bed the night of your first shower and do not  sleep with pets. Day of Surgery : Do not apply any lotions/deodorants the morning of surgery.  Please wear clean clothes to the hospital/surgery center.  FAILURE TO FOLLOW THESE INSTRUCTIONS MAY RESULT IN THE CANCELLATION OF YOUR SURGERY PATIENT SIGNATURE_________________________________  NURSE SIGNATURE__________________________________  ________________________________________________________________________                                                          Questions call Ember Henrikson pre op nurse [phone]

## 2020-09-02 NOTE — Telephone Encounter (Signed)
Next visit is 10/28/20

## 2020-09-03 ENCOUNTER — Other Ambulatory Visit: Payer: Self-pay | Admitting: Psychiatry

## 2020-09-03 DIAGNOSIS — F331 Major depressive disorder, recurrent, moderate: Secondary | ICD-10-CM

## 2020-09-03 DIAGNOSIS — F411 Generalized anxiety disorder: Secondary | ICD-10-CM

## 2020-09-03 DIAGNOSIS — F5105 Insomnia due to other mental disorder: Secondary | ICD-10-CM

## 2020-09-03 MED ORDER — VENLAFAXINE HCL ER 37.5 MG PO CP24
ORAL_CAPSULE | ORAL | 1 refills | Status: DC
Start: 2020-09-03 — End: 2020-10-28

## 2020-09-03 MED ORDER — TRAZODONE HCL 50 MG PO TABS: ORAL_TABLET | ORAL | 1 refills | Status: DC

## 2020-09-03 NOTE — Telephone Encounter (Signed)
Patient is rescheduled for 04/21, okay to send refill?

## 2020-09-03 NOTE — Telephone Encounter (Signed)
I sent RX 

## 2020-09-05 NOTE — H&P (Addendum)
Kimberly Maldonado is a 45 y.o. female ,  P: 1-0-2-1 who presents for hysterectomy and bilateral salpingo-oophorectomy because of estrogen receptor positive breast cancer. In May 2018 the patient was diagnosed with Stage [id number] right breast cancer for which she underwent a bilateral mastectomy in October of that same year. The patient is on chemotherapy that has placed her in a menopausal state. She denies any vaginal bleeding, pelvic pain, changes in bowel or bladder function or vaginitis symptoms. A pelvic ultrasound in November 2019 revealed an anteverted uterus: fundus to external os-7.7 cm (volume = 72.4 cc) 7.69 x 5.64 x 3.19 cm, endometrium: 4 mm with a left sub-serosal fibroid 1.7 cm ; right ovary-2.90 cm and left ovary-3.09 cm. Given her diagnosis the patient would like to proceed with a hysterectomy and removal of both fallopian tubes and ovaries.    Past Medical History  OB History: G: 3;  P: 1-0-2-1; SVB 2006 weighing 6 lbs. 7 oz.  GYN History: menarche: 45 YO; LMP: 09/2016  (menopausal due to chemotherapy);   Denies history of abnormal PAP smear.   Last PAP smear: 2021-normal with negative HPV  Medical History: Right Breast Cancer, Migraine, Insomnia, Depression, Anxiety, Left Kidney Donor, HSV-1, Central Centrifugal Scarring Alopecia and Vitamin D Deficiency.  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; 2018 Bilateral Mastectomy and 2021 Bilateral Breast Reconstruction Denies problems with anesthesia or history of blood transfusions  Family History: Cancers: Breast, Prostate, Thyroid and Bone; Hypertension, COPD and Diabetes Mellitus  Social History: Divorced and employed in [employer];  Former Smoker and occasional alcohol   Medications:  Exemestane 25 mg daily Ibuprofen 600 mg prn Trazodone 50 mg qhs Valacyclovir 500 mg prn Venlafaxine  ER  37.5 mg daily with breakfast Zoladex 10.8 mg Sub-Cutaneous Implant   No Known Allergies  Denies sensitivity to peanuts,  shellfish, soy, latex or adhesives.  ROS: Admits to reading glasses, but  denies headache, vision changes, nasal congestion, dysphagia, tinnitus, dizziness, hoarseness, cough,  chest pain, shortness of breath, nausea, vomiting, diarrhea,constipation,  urinary frequency, urgency  dysuria, hematuria, vaginitis symptoms, pelvic pain, swelling of joints,easy bruising,  myalgias, arthralgias, skin rashes, unexplained weight loss and except as is mentioned in the history of present illness, patient's review of systems is otherwise negative.    Physical Exam  Bp: 110/64;  P: 69  bpm;  R: 16;  Temperature: 98.7 degrees F orally;  Weight: 191 lbs.;  Height: 5'9";  BMI: 28.2  Neck: supple without masses or thyromegaly Lungs: clear to auscultation Heart: regular rate and rhythm Abdomen: soft, non-tender and no organomegaly Pelvic:EGBUS- wnl; vagina-normal rugae; uterus-normal size, cervix without lesions or motion tenderness; adnexae-no tenderness or masses Extremities:  no clubbing, cyanosis or edema   Assesment: Estrogen Receptor Positive Breast Cancer    Disposition: Robotic-assisted hysterectomy was reviewed with the patient.  Benefits of the robotic approach include lesser postoperative pain, less blood loss during surgery, reduced risk of injury to other organs due to better visualization with a 3-D HD 10 times magnifying camera, shorter hospital stay between 0-1 night and rapid recovery with return to daily routine in 2-3 weeks. Although robotic-assisted hysterectomy has a longer operative time than traditional laparotomy, in a patient with good medical history, the benefits usually outweigh the risks.   The indication for her surgery was reviewed, along with its risks, to include but not limited to: reaction to anesthesia, bleeding, infection, injury to other organs, need for laparotomy, transient post-operative facial edema and increased risk of pelvic prolapse associated  with any  hysterectomy. The patient was given a Miralax Bowel Prep to be completed the day before her surgery.  The patient verbalized understanding of these risks and pre-operative instructions  and she has consented to proceed with a Robot Assisted Total Laparoscopic Hysterectomy with Bilateral Salpingo-oophorectomy at [facility] on September 09, 2020 @ 7:30 a.m.  CSN# [id number]   Melburn Treiber J. Florene Glen, PA-C  for Dr. Dede Query. [place]ard

## 2020-09-07 ENCOUNTER — Encounter (HOSPITAL_COMMUNITY)
Admission: RE | Admit: 2020-09-07 | Discharge: 2020-09-07 | Disposition: A | Discharge status: Home / Self Care | Payer: 59 | Source: Ambulatory Visit | Attending: Obstetrics and Gynecology | Admitting: Obstetrics and Gynecology

## 2020-09-07 ENCOUNTER — Other Ambulatory Visit: Payer: Self-pay

## 2020-09-07 DIAGNOSIS — Z01812 Encounter for preprocedural laboratory examination: Secondary | ICD-10-CM | POA: Diagnosis present

## 2020-09-07 LAB — CBC
HCT: 41.2 % (ref 36.0–46.0)
Hemoglobin: 13.4 g/dL (ref 12.0–15.0)
MCH: 29.6 pg (ref 26.0–34.0)
MCHC: 32.5 g/dL (ref 30.0–36.0)
MCV: 91.2 fL (ref 80.0–100.0)
Platelets: 236 10*3/uL (ref 150–400)
RBC: 4.52 MIL/uL (ref 3.87–5.11)
RDW: 15.4 % (ref 11.5–15.5)
WBC: 3 10*3/uL — ABNORMAL LOW (ref 4.0–10.5)
nRBC: 0 % (ref 0.0–0.2)

## 2020-09-07 LAB — BASIC METABOLIC PANEL
Anion gap: 8 (ref 5–15)
BUN: 14 mg/dL (ref 6–20)
CO2: 25 mmol/L (ref 22–32)
Calcium: 9.8 mg/dL (ref 8.9–10.3)
Chloride: 107 mmol/L (ref 98–111)
Creatinine, Ser: 1.02 mg/dL — ABNORMAL HIGH (ref 0.44–1.00)
GFR, Estimated: >60 mL/min (ref >60)
Glucose, Bld: 100 mg/dL — ABNORMAL HIGH (ref 70–99)
Potassium: 4 mmol/L (ref 3.5–5.1)
Sodium: 140 mmol/L (ref 135–145)

## 2020-09-08 ENCOUNTER — Encounter (HOSPITAL_BASED_OUTPATIENT_CLINIC_OR_DEPARTMENT_OTHER): Payer: Self-pay | Admitting: Obstetrics and Gynecology

## 2020-09-08 NOTE — Anesthesia Preprocedure Evaluation (Addendum)
Anesthesia Evaluation  Patient identified by MRN, date of birth, ID band Patient awake    Reviewed: Allergy & Precautions, NPO status , Patient's Chart, lab work & pertinent test results  Airway Mallampati: II  TM Distance: >3 FB Neck ROM: Full    Dental no notable dental hx. (+) Teeth Intact   Pulmonary former smoker,  Covid-19 08/09/20   Pulmonary exam normal breath sounds clear to auscultation       Cardiovascular negative cardio ROS Normal cardiovascular exam Rhythm:Regular Rate:Normal  Lymphedema right arm   Neuro/Psych PSYCHIATRIC DISORDERS Anxiety Depression  Neuromuscular disease    GI/Hepatic negative GI ROS, Neg liver ROS,   Endo/Other  Estrogen receptor + Right breast Ca S/P mastectomy, ALND, ChemoRx and RT  Renal/GU negative Renal ROS  negative genitourinary   Musculoskeletal negative musculoskeletal ROS (+)   Abdominal   Peds  Hematology   Anesthesia Other Findings   Reproductive/Obstetrics                            Anesthesia Physical Anesthesia Plan  ASA: II  Anesthesia Plan: General   Post-op Pain Management:    Induction: Intravenous  PONV Risk Score and Plan: 4 or greater and Treatment may vary due to age or medical condition, Scopolamine patch - Pre-op, Dexamethasone and Ondansetron  Airway Management Planned: Oral ETT  Additional Equipment:   Intra-op Plan:   Post-operative Plan: Extubation in OR  Informed Consent: I have reviewed the patients History and Physical, chart, labs and discussed the procedure including the risks, benefits and alternatives for the proposed anesthesia with the patient or authorized representative who has indicated his/her understanding and acceptance.     Dental advisory given  Plan Discussed with: Anesthesiologist and CRNA  Anesthesia Plan Comments:        Anesthesia Quick Evaluation

## 2020-09-09 ENCOUNTER — Ambulatory Visit (HOSPITAL_BASED_OUTPATIENT_CLINIC_OR_DEPARTMENT_OTHER): Payer: 59 | Admitting: Certified Registered Nurse Anesthetist

## 2020-09-09 ENCOUNTER — Encounter (HOSPITAL_BASED_OUTPATIENT_CLINIC_OR_DEPARTMENT_OTHER): Payer: Self-pay | Admitting: Obstetrics and Gynecology

## 2020-09-09 ENCOUNTER — Encounter (HOSPITAL_BASED_OUTPATIENT_CLINIC_OR_DEPARTMENT_OTHER)
Admission: RE | Disposition: A | Discharge status: Home / Self Care | Payer: Self-pay | Source: Home / Self Care | Attending: Obstetrics and Gynecology

## 2020-09-09 ENCOUNTER — Ambulatory Visit (HOSPITAL_BASED_OUTPATIENT_CLINIC_OR_DEPARTMENT_OTHER)
Admission: RE | Admit: 2020-09-09 | Discharge: 2020-09-09 | Disposition: A | Discharge status: Home / Self Care | Payer: 59 | Attending: Obstetrics and Gynecology | Admitting: Obstetrics and Gynecology

## 2020-09-09 DIAGNOSIS — Z9013 Acquired absence of bilateral breasts and nipples: Secondary | ICD-10-CM | POA: Insufficient documentation

## 2020-09-09 DIAGNOSIS — Z8616 Personal history of COVID-19: Secondary | ICD-10-CM | POA: Insufficient documentation

## 2020-09-09 DIAGNOSIS — Z87891 Personal history of nicotine dependence: Secondary | ICD-10-CM | POA: Diagnosis not present

## 2020-09-09 DIAGNOSIS — Z905 Acquired absence of kidney: Secondary | ICD-10-CM | POA: Diagnosis not present

## 2020-09-09 DIAGNOSIS — C50411 Malignant neoplasm of upper-outer quadrant of right female breast: Secondary | ICD-10-CM

## 2020-09-09 DIAGNOSIS — Z170 Estrogen receptor positive status [ER+]: Secondary | ICD-10-CM | POA: Diagnosis not present

## 2020-09-09 DIAGNOSIS — C50911 Malignant neoplasm of unspecified site of right female breast: Secondary | ICD-10-CM | POA: Diagnosis not present

## 2020-09-09 DIAGNOSIS — C50919 Malignant neoplasm of unspecified site of unspecified female breast: Secondary | ICD-10-CM | POA: Diagnosis present

## 2020-09-09 DIAGNOSIS — Z79899 Other long term (current) drug therapy: Secondary | ICD-10-CM | POA: Insufficient documentation

## 2020-09-09 HISTORY — DX: Lymphedema, not elsewhere classified: I89.0

## 2020-09-09 HISTORY — PX: ROBOTIC ASSISTED TOTAL HYSTERECTOMY WITH BILATERAL SALPINGO OOPHERECTOMY: SHX6086

## 2020-09-09 LAB — TYPE AND SCREEN
ABO/RH(D): O POS
Antibody Screen: NEGATIVE

## 2020-09-09 LAB — POCT PREGNANCY, URINE: Preg Test, Ur: NEGATIVE

## 2020-09-09 SURGERY — HYSTERECTOMY, TOTAL, ROBOT-ASSISTED, LAPAROSCOPIC, WITH BILATERAL SALPINGO-OOPHORECTOMY
Anesthesia: Choice | Laterality: Bilateral

## 2020-09-09 SURGERY — HYSTERECTOMY, TOTAL, ROBOT-ASSISTED, LAPAROSCOPIC, WITH BILATERAL SALPINGO-OOPHORECTOMY
Anesthesia: General | Site: Abdomen | Laterality: Bilateral

## 2020-09-09 MED ORDER — SODIUM CHLORIDE 0.9 % IR SOLN
Status: DC | PRN
  Administered 2020-09-09: 1000 mL

## 2020-09-09 MED ORDER — ROCURONIUM BROMIDE 10 MG/ML (PF) SYRINGE
PREFILLED_SYRINGE | INTRAVENOUS | Status: AC
  Filled 2020-09-09: qty 10

## 2020-09-09 MED ORDER — CEFAZOLIN SODIUM-DEXTROSE 2-4 GM/100ML-% IV SOLN
2.0000 g | INTRAVENOUS | Status: AC
  Administered 2020-09-09: 2 g via INTRAVENOUS

## 2020-09-09 MED ORDER — LIDOCAINE 2% (20 MG/ML) 5 ML SYRINGE
INTRAMUSCULAR | Status: DC | PRN
  Administered 2020-09-09: 80 mg via INTRAVENOUS

## 2020-09-09 MED ORDER — ONDANSETRON HCL 4 MG/2ML IJ SOLN
INTRAMUSCULAR | Status: DC | PRN
  Administered 2020-09-09: 4 mg via INTRAVENOUS

## 2020-09-09 MED ORDER — DEXAMETHASONE SODIUM PHOSPHATE 10 MG/ML IJ SOLN
INTRAMUSCULAR | Status: DC | PRN
  Administered 2020-09-09: 10 mg via INTRAVENOUS

## 2020-09-09 MED ORDER — ROCURONIUM BROMIDE 10 MG/ML (PF) SYRINGE
PREFILLED_SYRINGE | INTRAVENOUS | Status: DC | PRN
  Administered 2020-09-09: 70 mg via INTRAVENOUS
  Administered 2020-09-09: 30 mg via INTRAVENOUS

## 2020-09-09 MED ORDER — DOCUSATE SODIUM 100 MG PO CAPS
ORAL_CAPSULE | ORAL | Status: AC
  Filled 2020-09-09: qty 1

## 2020-09-09 MED ORDER — ONDANSETRON HCL 4 MG PO TABS: 4.0000 mg | ORAL_TABLET | Freq: Four times a day (QID) | ORAL | Status: DC | PRN

## 2020-09-09 MED ORDER — ACETAMINOPHEN 500 MG PO TABS
1000.0000 mg | ORAL_TABLET | Freq: Four times a day (QID) | ORAL | Status: DC
  Administered 2020-09-09: 1000 mg via ORAL

## 2020-09-09 MED ORDER — ACETAMINOPHEN 500 MG PO TABS: ORAL_TABLET | ORAL | 1 refills | Status: AC

## 2020-09-09 MED ORDER — MIDAZOLAM HCL 2 MG/2ML IJ SOLN
INTRAMUSCULAR | Status: AC
  Filled 2020-09-09: qty 2

## 2020-09-09 MED ORDER — LACTATED RINGERS IV SOLN: INTRAVENOUS | Status: DC

## 2020-09-09 MED ORDER — KETOROLAC TROMETHAMINE 30 MG/ML IJ SOLN
30.0000 mg | Freq: Once | INTRAMUSCULAR | Status: AC
  Administered 2020-09-09: 30 mg via INTRAVENOUS

## 2020-09-09 MED ORDER — PHENYLEPHRINE 40 MCG/ML (10ML) SYRINGE FOR IV PUSH (FOR BLOOD PRESSURE SUPPORT)
PREFILLED_SYRINGE | INTRAVENOUS | Status: AC
  Filled 2020-09-09: qty 10

## 2020-09-09 MED ORDER — SODIUM CHLORIDE (PF) 0.9 % IJ SOLN
INTRAMUSCULAR | Status: DC | PRN
  Administered 2020-09-09: 15 mL
  Administered 2020-09-09: 30 mL
  Administered 2020-09-09: 5 mL

## 2020-09-09 MED ORDER — ACETAMINOPHEN 500 MG PO TABS
ORAL_TABLET | ORAL | Status: AC
  Filled 2020-09-09: qty 2

## 2020-09-09 MED ORDER — PROPOFOL 500 MG/50ML IV EMUL
INTRAVENOUS | Status: AC
  Filled 2020-09-09: qty 50

## 2020-09-09 MED ORDER — GABAPENTIN 300 MG PO CAPS
300.0000 mg | ORAL_CAPSULE | ORAL | Status: AC
  Administered 2020-09-09: 300 mg via ORAL

## 2020-09-09 MED ORDER — SCOPOLAMINE 1 MG/3DAYS TD PT72
MEDICATED_PATCH | TRANSDERMAL | Status: AC
  Filled 2020-09-09: qty 1

## 2020-09-09 MED ORDER — KETOROLAC TROMETHAMINE 30 MG/ML IJ SOLN
INTRAMUSCULAR | Status: AC
  Filled 2020-09-09: qty 1

## 2020-09-09 MED ORDER — OXYCODONE HCL 5 MG PO TABS
5.0000 mg | ORAL_TABLET | ORAL | Status: DC | PRN
  Administered 2020-09-09 (×2): 5 mg via ORAL

## 2020-09-09 MED ORDER — PANTOPRAZOLE SODIUM 40 MG PO TBEC
40.0000 mg | DELAYED_RELEASE_TABLET | Freq: Every day | ORAL | Status: DC
  Administered 2020-09-09: 40 mg via ORAL

## 2020-09-09 MED ORDER — OXYCODONE HCL 5 MG PO TABS
ORAL_TABLET | ORAL | Status: AC
  Filled 2020-09-09: qty 1

## 2020-09-09 MED ORDER — CELECOXIB 200 MG PO CAPS
400.0000 mg | ORAL_CAPSULE | ORAL | Status: AC
  Administered 2020-09-09: 400 mg via ORAL

## 2020-09-09 MED ORDER — OXYCODONE HCL 5 MG PO TABS: 5.0000 mg | ORAL_TABLET | Freq: Once | ORAL | Status: DC | PRN

## 2020-09-09 MED ORDER — FENTANYL CITRATE (PF) 250 MCG/5ML IJ SOLN
INTRAMUSCULAR | Status: AC
  Filled 2020-09-09: qty 5

## 2020-09-09 MED ORDER — SIMETHICONE 80 MG PO CHEW
80.0000 mg | CHEWABLE_TABLET | Freq: Four times a day (QID) | ORAL | Status: DC | PRN
  Administered 2020-09-09: 80 mg via ORAL

## 2020-09-09 MED ORDER — LIDOCAINE HCL (PF) 2 % IJ SOLN
INTRAMUSCULAR | Status: AC
  Filled 2020-09-09: qty 5

## 2020-09-09 MED ORDER — SCOPOLAMINE 1 MG/3DAYS TD PT72
1.0000 | MEDICATED_PATCH | TRANSDERMAL | Status: DC
  Administered 2020-09-09: 1.5 mg via TRANSDERMAL

## 2020-09-09 MED ORDER — PROPOFOL 10 MG/ML IV BOLUS
INTRAVENOUS | Status: DC | PRN
  Administered 2020-09-09: 160 mg via INTRAVENOUS

## 2020-09-09 MED ORDER — ONDANSETRON HCL 4 MG/2ML IJ SOLN
INTRAMUSCULAR | Status: AC
  Filled 2020-09-09: qty 2

## 2020-09-09 MED ORDER — DEXAMETHASONE SODIUM PHOSPHATE 10 MG/ML IJ SOLN
INTRAMUSCULAR | Status: AC
  Filled 2020-09-09: qty 1

## 2020-09-09 MED ORDER — CEFAZOLIN SODIUM-DEXTROSE 2-4 GM/100ML-% IV SOLN
INTRAVENOUS | Status: AC
  Filled 2020-09-09: qty 100

## 2020-09-09 MED ORDER — PANTOPRAZOLE SODIUM 40 MG PO TBEC
DELAYED_RELEASE_TABLET | ORAL | Status: AC
  Filled 2020-09-09: qty 1

## 2020-09-09 MED ORDER — HYDROMORPHONE HCL 1 MG/ML IJ SOLN
0.2500 mg | INTRAMUSCULAR | Status: DC | PRN
  Administered 2020-09-09 (×2): 0.5 mg via INTRAVENOUS

## 2020-09-09 MED ORDER — IBUPROFEN 200 MG PO TABS: 600.0000 mg | ORAL_TABLET | Freq: Four times a day (QID) | ORAL | Status: DC

## 2020-09-09 MED ORDER — ROPIVACAINE HCL 5 MG/ML IJ SOLN
INTRAMUSCULAR | Status: DC | PRN
  Administered 2020-09-09: 5 mL
  Administered 2020-09-09: 15 mL
  Administered 2020-09-09: 30 mL

## 2020-09-09 MED ORDER — FENTANYL CITRATE (PF) 100 MCG/2ML IJ SOLN
INTRAMUSCULAR | Status: DC | PRN
  Administered 2020-09-09: 100 ug via INTRAVENOUS
  Administered 2020-09-09 (×3): 50 ug via INTRAVENOUS

## 2020-09-09 MED ORDER — IBUPROFEN 600 MG PO TABS: ORAL_TABLET | ORAL | 1 refills | Status: AC

## 2020-09-09 MED ORDER — ONDANSETRON HCL 4 MG/2ML IJ SOLN: 4.0000 mg | Freq: Four times a day (QID) | INTRAMUSCULAR | Status: DC | PRN

## 2020-09-09 MED ORDER — ENSURE PRE-SURGERY PO LIQD: 296.0000 mL | Freq: Once | ORAL | Status: DC

## 2020-09-09 MED ORDER — PHENYLEPHRINE 40 MCG/ML (10ML) SYRINGE FOR IV PUSH (FOR BLOOD PRESSURE SUPPORT)
PREFILLED_SYRINGE | INTRAVENOUS | Status: DC | PRN
  Administered 2020-09-09: 80 ug via INTRAVENOUS
  Administered 2020-09-09: 120 ug via INTRAVENOUS
  Administered 2020-09-09 (×2): 80 ug via INTRAVENOUS

## 2020-09-09 MED ORDER — CELECOXIB 200 MG PO CAPS
ORAL_CAPSULE | ORAL | Status: AC
  Filled 2020-09-09: qty 2

## 2020-09-09 MED ORDER — GABAPENTIN 300 MG PO CAPS
ORAL_CAPSULE | ORAL | Status: AC
  Filled 2020-09-09: qty 1

## 2020-09-09 MED ORDER — SUGAMMADEX SODIUM 200 MG/2ML IV SOLN
INTRAVENOUS | Status: DC | PRN
  Administered 2020-09-09: 200 mg via INTRAVENOUS

## 2020-09-09 MED ORDER — SIMETHICONE 80 MG PO CHEW
CHEWABLE_TABLET | ORAL | Status: AC
  Filled 2020-09-09: qty 1

## 2020-09-09 MED ORDER — MENTHOL 3 MG MT LOZG: 1.0000 | LOZENGE | OROMUCOSAL | Status: DC | PRN

## 2020-09-09 MED ORDER — OXYCODONE HCL 5 MG PO TABS: ORAL_TABLET | ORAL | 0 refills | Status: DC

## 2020-09-09 MED ORDER — MIDAZOLAM HCL 2 MG/2ML IJ SOLN
INTRAMUSCULAR | Status: DC | PRN
  Administered 2020-09-09: 2 mg via INTRAVENOUS

## 2020-09-09 MED ORDER — POVIDONE-IODINE 10 % EX SWAB
2.0000 "application " | Freq: Once | CUTANEOUS | Status: AC
  Administered 2020-09-09: 2 via TOPICAL

## 2020-09-09 MED ORDER — HYDROMORPHONE HCL 1 MG/ML IJ SOLN
INTRAMUSCULAR | Status: AC
  Filled 2020-09-09: qty 1

## 2020-09-09 MED ORDER — DOCUSATE SODIUM 100 MG PO CAPS
100.0000 mg | ORAL_CAPSULE | Freq: Two times a day (BID) | ORAL | Status: DC
  Administered 2020-09-09: 100 mg via ORAL

## 2020-09-09 MED ORDER — AMISULPRIDE (ANTIEMETIC) 5 MG/2ML IV SOLN: 10.0000 mg | Freq: Once | INTRAVENOUS | Status: DC

## 2020-09-09 MED ORDER — OXYCODONE HCL 5 MG/5ML PO SOLN: 5.0000 mg | Freq: Once | ORAL | Status: DC | PRN

## 2020-09-09 MED ORDER — ACETAMINOPHEN 500 MG PO TABS
1000.0000 mg | ORAL_TABLET | ORAL | Status: AC
  Administered 2020-09-09: 1000 mg via ORAL

## 2020-09-09 SURGICAL SUPPLY — 68 items
ADH SKN CLS APL DERMABOND .7 (GAUZE/BANDAGES/DRESSINGS) ×1
BAG DECANTER FOR FLEXI CONT (MISCELLANEOUS) ×1 IMPLANT
BARRIER ADHS 3X4 INTERCEED (GAUZE/BANDAGES/DRESSINGS) ×2 IMPLANT
BRR ADH 4X3 ABS CNTRL BYND (GAUZE/BANDAGES/DRESSINGS) ×1
CATH FOLEY 3WAY  5CC 16FR (CATHETERS) ×2
CATH FOLEY 3WAY 5CC 16FR (CATHETERS) ×1 IMPLANT
COVER BACK TABLE 60X90IN (DRAPES) ×2 IMPLANT
COVER TIP SHEARS 8 DVNC (MISCELLANEOUS) ×1 IMPLANT
COVER TIP SHEARS 8MM DA VINCI (MISCELLANEOUS) ×2
COVER WAND RF STERILE (DRAPES) ×2 IMPLANT
DECANTER SPIKE VIAL GLASS SM (MISCELLANEOUS) ×5 IMPLANT
DEFOGGER SCOPE WARMER CLEARIFY (MISCELLANEOUS) ×2 IMPLANT
DERMABOND ADVANCED (GAUZE/BANDAGES/DRESSINGS) ×1
DERMABOND ADVANCED .7 DNX12 (GAUZE/BANDAGES/DRESSINGS) ×1 IMPLANT
DRAPE ARM DVNC X/XI (DISPOSABLE) ×4 IMPLANT
DRAPE COLUMN DVNC XI (DISPOSABLE) ×1 IMPLANT
DRAPE DA VINCI XI ARM (DISPOSABLE) ×8
DRAPE DA VINCI XI COLUMN (DISPOSABLE) ×2
DRAPE UTILITY XL STRL (DRAPES) ×2 IMPLANT
DURAPREP 26ML APPLICATOR (WOUND CARE) ×2 IMPLANT
ELECT REM PT RETURN 9FT ADLT (ELECTROSURGICAL) ×2
ELECTRODE REM PT RTRN 9FT ADLT (ELECTROSURGICAL) ×1 IMPLANT
GAUZE 4X4 16PLY RFD (DISPOSABLE) ×2 IMPLANT
GLOVE SURG ENC MOIS LTX SZ7 (GLOVE) ×2 IMPLANT
GLOVE SURG LTX SZ6.5 (GLOVE) ×7 IMPLANT
GLOVE SURG LTX SZ7.5 (GLOVE) ×2 IMPLANT
GLOVE SURG UNDER POLY LF SZ6.5 (GLOVE) ×3 IMPLANT
GLOVE SURG UNDER POLY LF SZ7 (GLOVE) ×12 IMPLANT
GLOVE SURG UNDER POLY LF SZ7.5 (GLOVE) ×2 IMPLANT
IRRIG SUCT STRYKERFLOW 2 WTIP (MISCELLANEOUS) ×2
IRRIGATION SUCT STRKRFLW 2 WTP (MISCELLANEOUS) ×1 IMPLANT
IV NS 1000ML (IV SOLUTION) ×2
IV NS 1000ML BAXH (IV SOLUTION) IMPLANT
KIT TURNOVER CYSTO (KITS) ×2 IMPLANT
LEGGING LITHOTOMY PAIR STRL (DRAPES) ×2 IMPLANT
MANIFOLD NEPTUNE II (INSTRUMENTS) ×1 IMPLANT
NEEDLE HYPO 22GX1.5 SAFETY (NEEDLE) ×2 IMPLANT
OBTURATOR OPTICAL STANDARD 8MM (TROCAR) ×2
OBTURATOR OPTICAL STND 8 DVNC (TROCAR) ×1
OBTURATOR OPTICALSTD 8 DVNC (TROCAR) ×1 IMPLANT
OCCLUDER COLPOPNEUMO (BALLOONS) ×2 IMPLANT
PACK ROBOT WH (CUSTOM PROCEDURE TRAY) ×2 IMPLANT
PACK ROBOTIC GOWN (GOWN DISPOSABLE) ×2 IMPLANT
PACK TRENDGUARD 450 HYBRID PRO (MISCELLANEOUS) ×1 IMPLANT
PAD OB MATERNITY 4.3X12.25 (PERSONAL CARE ITEMS) ×3 IMPLANT
PAD PREP 24X48 CUFFED NSTRL (MISCELLANEOUS) ×2 IMPLANT
POUCH LAPAROSCOPIC INSTRUMENT (MISCELLANEOUS) ×2 IMPLANT
PROTECTOR NERVE ULNAR (MISCELLANEOUS) ×5 IMPLANT
SEAL CANN UNIV 5-8 DVNC XI (MISCELLANEOUS) ×4 IMPLANT
SEAL XI 5MM-8MM UNIVERSAL (MISCELLANEOUS) ×8
SEALER VESSEL DA VINCI XI (MISCELLANEOUS) ×2
SEALER VESSEL EXT DVNC XI (MISCELLANEOUS) IMPLANT
SET IRRIG Y TYPE TUR BLADDER L (SET/KITS/TRAYS/PACK) ×1 IMPLANT
SET TRI-LUMEN FLTR TB AIRSEAL (TUBING) ×2 IMPLANT
SOL PREP PROV IODINE SCRUB 4OZ (MISCELLANEOUS) ×2 IMPLANT
STRIP CLOSURE SKIN 1/4X4 (GAUZE/BANDAGES/DRESSINGS) ×2 IMPLANT
SUT MNCRL AB 3-0 PS2 27 (SUTURE) ×4 IMPLANT
SUT VIC AB 0 CT1 27 (SUTURE) ×4
SUT VIC AB 0 CT1 27XBRD ANBCTR (SUTURE) ×2 IMPLANT
SUT VIC AB 2-0 UR6 27 (SUTURE) ×4 IMPLANT
SUT VICRYL 0 UR6 27IN ABS (SUTURE) ×3 IMPLANT
SUT VLOC 180 0 9IN  GS21 (SUTURE) ×4
SUT VLOC 180 0 9IN GS21 (SUTURE) ×2 IMPLANT
TIP UTERINE 6.7X6CM WHT DISP (MISCELLANEOUS) ×1 IMPLANT
TOWEL OR 17X26 10 PK STRL BLUE (TOWEL DISPOSABLE) ×2 IMPLANT
TRENDGUARD 450 HYBRID PRO PACK (MISCELLANEOUS) ×2
TROCAR PORT AIRSEAL 8X120 (TROCAR) ×2 IMPLANT
WATER STERILE IRR 500ML POUR (IV SOLUTION) ×1 IMPLANT

## 2020-09-09 NOTE — Anesthesia Procedure Notes (Addendum)
Procedure Name: Intubation Date/Time: 09/09/2020 7:37 AM Performed by: Genelle Bal, CRNA Pre-anesthesia Checklist: Patient identified, Emergency Drugs available, Suction available and Patient being monitored Patient Re-evaluated:Patient Re-evaluated prior to induction Oxygen Delivery Method: Circle system utilized Preoxygenation: Pre-oxygenation with 100% oxygen Induction Type: IV induction Ventilation: Mask ventilation without difficulty Laryngoscope Size: Miller and 2 Grade View: Grade I Tube type: Oral Tube size: 7.0 mm Number of attempts: 1 Airway Equipment and Method: Stylet Placement Confirmation: ETT inserted through vocal cords under direct vision,  positive ETCO2 and breath sounds checked- equal and bilateral Secured at: 21 cm Tube secured with: Tape Dental Injury: Teeth and Oropharynx as per pre-operative assessment

## 2020-09-09 NOTE — Discharge Instructions (Signed)
Call [facility] @ [phone] if:  You have a temperature greater than or equal to 100.4 degrees Farenheit orally You have pain that is not made better by the pain medication given and taken as directed You have excessive bleeding or problems urinating  Take Colace (Docusate Sodium/Stool Softener) 100 mg 2-3 times daily while taking narcotic pain medicine to avoid constipation or until bowel movements are regular. Take, with food, Ibuprofen 600 mg every 6 hours for 5 days then as needed for pain  (first dose 10:30 pm day of surgery Take Acetaminophen 500 mg  (#2 tablets)  every 6 hours for 5 days then as needed for pain.    You may drive after 1 week You may walk up steps Do no walk your pet (if you have one) for 6 weeks  You may shower tomorrow You may resume a regular diet  Keep incisions clean and dry Do not lift over 15 pounds for 6 weeks Avoid anything in vagina for 6 weeks (or until after your post-operative visit)

## 2020-09-09 NOTE — Anesthesia Postprocedure Evaluation (Signed)
Anesthesia Post Note  Patient: Kimberly Maldonado  Procedure(s) Performed: XI ROBOTIC ASSISTED TOTAL HYSTERECTOMY WITH BILATERAL SALPINGO OOPHORECTOMY (Bilateral Abdomen)     Patient location during evaluation: PACU Anesthesia Type: General Level of consciousness: awake and alert and oriented Pain management: pain level controlled Vital Signs Assessment: post-procedure vital signs reviewed and stable Respiratory status: spontaneous breathing, nonlabored ventilation and respiratory function stable Cardiovascular status: blood pressure returned to baseline and stable Postop Assessment: no apparent nausea or vomiting Anesthetic complications: no   No complications documented.  Last Vitals:  Vitals:   09/09/20 1030 09/09/20 1045  BP: (!) 136/92 119/79  Pulse: (!) 58 65  Resp: 15 13  Temp:    SpO2: 100% 100%    Last Pain:  Vitals:   09/09/20 1100  TempSrc:   PainSc: 3                  Nysir Fergusson A.

## 2020-09-09 NOTE — Transfer of Care (Signed)
Immediate Anesthesia Transfer of Care Note  Patient: Kimberly Maldonado  Procedure(s) Performed: XI ROBOTIC ASSISTED TOTAL HYSTERECTOMY WITH BILATERAL SALPINGO OOPHORECTOMY (Bilateral Abdomen)  Patient Location: PACU  Anesthesia Type:General  Level of Consciousness: awake, alert  and oriented  Airway & Oxygen Therapy: Patient Spontanous Breathing and Patient connected to face mask oxygen  Post-op Assessment: Report given to RN and Post -op Vital signs reviewed and stable  Post vital signs: Reviewed and stable  Last Vitals:  Vitals Value Taken Time  BP 136/77 09/09/20 1028  Temp    Pulse 58 09/09/20 1030  Resp 15 09/09/20 1030  SpO2 100 % 09/09/20 1030  Vitals shown include unvalidated device data.  Last Pain:  Vitals:   09/09/20 0548  TempSrc: Oral  PainSc: 0-No pain      Patients Stated Pain Goal: 4 (08/09/41 8887)  Complications: No complications documented.

## 2020-09-09 NOTE — Op Note (Signed)
Preoperative diagnosis: Stage 2 estrogen receptor positive breast cancer, s/p left nephrectomy  Postoperative diagnosis: Same   Anesthesia: General  Anesthesiologist: Dr. Royce Macadamia  Procedure: Robotically assisted total hysterectomy with bilateral salpingectomy  Surgeon: Dr. Katharine Look Arthurine Oleary  Assistant: Earnstine Regal P.A.-C.  Estimated blood loss: 25 cc  Procedure:  After being informed of the planned procedure with possible complications including but not limited to bleeding, infection, injury to other organs, need for laparotomy, expected hospital stay and recovery, informed consent is obtained and patient is taken to or #5. She is placed in  lithotomy position on [place] with both arms padded and tucked on each side and bilateral knee-high sequential compressive devices. She is given general anesthesia with endotracheal intubation without any complication. She is prepped and draped in a sterile fashion. A three-way Foley catheter is inserted in her bladder.  Pelvic exam reveals: anteverted small uterus with 2 normal adnexa  A weighted speculum is inserted in the vagina and the anterior lip of the cervix is grasped with a tenaculum forcep. We proceed with a paracervical block and vaginal infiltration using ropivacaine 0.5% diluted 1 in 1 with saline. The uterus was then sounded at 7 cm. We easily dilate the cervix using Hegar dilator to  #27 which allows for easy placement of the intrauterine RUMI manipulator with a 2.5 KOH ring and a vaginal occluder. The ring is sutured to the cervix with 0 Vicryl.  Trocar placement is decided. We infiltrate  at the umbilicus with 10 cc of ropivacaine per protocol and perform a 10 mm semi-elleptical incision which is brought down bluntly to the fascia. The fascia is identified and grasped with Coker forceps. The fascia is incised with Mayo scissors. Peritoneum is entered bluntly. A pursestring suture of 0 Vicryl is placed on the fascia and a 10 mm Hassan  trocar is easily inserted in the abdominal cavity held in placed with a Purstring suture. This allows for easy insufflation of a pneumoperitoneum using warmed CO2 at a maximum pressure of 15 mm of mercury. 60 cc of Ropivacaine 0.5 % diluted 1 in 1 is sent in the pelvis and the patient is positioned in reverse Trendelenburg. We then placed one 24mm robotic trocar on the left, one 62mm robotic trocar on the right and one 10 mm patient's side assistant trocar on the right  after infiltrating every site  with ropivacaine per protocol. The robot is docked on the right of the patient after positioning her in Trendelenburg. A Vessel Seal is inserted in arm #4 and a Long bipolar forceps is inserted in arm #2.  Preparation and docking is completed in 55 minutes.  Observation: anterior and posterior cul-de-sac are normal, uterus, tubes and ovaries are normal.   We start on the right side by sealing and cutting the right round ligament .  This gives [place] entry into the retroperitoneal space with an easy dissection of the anterior broad ligament. The ligament was opened all the way to the left round ligament.  After identifying the right ureter, we Vessel Seal and cut the right infundibulopelvic ligament.   We then proceed with systematic dissection of the bladder from the anterior vaginal cuff which is easily identified with the KOH ring. The plane of dissection is confirmed with filling the bladder with 200 cc of saline. We are able to dissect the bladder 2 cm below the KOH ring. We then opened the posterior right broad ligament all the way to the posterior KOH ring after identifying the full  course of the right ureter.   Moving to the left side we Seal and cut  the left round ligament .Entry into the retroperitoneal space allows [place] to complete dissection of the bladder on the left side and skeletonized the uterine vessels. The left broad ligament is then dissected all the way to the posterior KOH ring. We Vessel Seal  and cut the left infundibulopelvic ligament.  With pressure on the KOH ring and the bladder fully dissected, we cauterize and cut the uterine vessels on both sides at the level of the KOH ring.  The vaginal occluder is inflated and we proceed with a 360 colpotomy using an open monopolar scissors and freeing the uterus entirely.  The uterus is delivered vaginally with gentle traction. The vaginal occluder is reinserted in the vagina to maintain pneumoperitoneum.  Instruments are then modified for a suture cut in arm #4 and a long tip forcep in arm #2. We proceed with closure of the vaginal cuff with 2 running sutures of 0 V-Lock. We irrigate profusely with warm saline and confirm a satisfactory hemostasis as well as a normal right ureter with good mobility and no dilatation.  A sheet of Interceed  is placed on the vaginal cuff and behind the ovaries.  All instruments are then removed and the robot is undocked. Console time: 1 hour and 7 minutes.  All trochars are removed under direct visualization after evacuating the pneumoperitoneum.  The fascia of the umbilical incision is closed with the previously placed pursestring suture of 0 Vicryl. The fascia of the right assistant trocar is closed with a figure-of-eight suture of 0 Vicryl. All incisions are then closed with subcuticular suture of 3-0 Monocryl and Dermabond.  A speculum is inserted in the vagina to confirm a adequate closure of the vaginal cuff and good hemostasis.  Instrument and sponge count is complete x2. The procedure is well tolerated by the patient is taken to recovery room in a well and stable condition.  Dr Cletis Media was present and scrubbed at all times. Surgical assistance was required due to the complexity of the anatomy and the robotic approach of the procedure.   Estimated blood loss: 25 cc  Specimen: Uterus, ovaries and  tubes weighing 81 g sent to pathology

## 2020-09-09 NOTE — Progress Notes (Signed)
Pt. Did not want family present for discharge instructions

## 2020-09-09 NOTE — Interval H&P Note (Signed)
History and Physical Interval Note:  09/09/2020 7:17 AM  Kimberly Maldonado  has presented today for surgery, with the diagnosis of BREAST CANCER ESTROGEN POSITIVE.  The various methods of treatment have been discussed with the patient and family. After consideration of risks, benefits and other options for treatment, the patient has consented to  Procedure(s): XI ROBOTIC ASSISTED TOTAL HYSTERECTOMY WITH BILATERAL SALPINGO OOPHORECTOMY (Bilateral) as a surgical intervention.  The patient's history has been reviewed, patient examined, no change in status, stable for surgery.  I have reviewed the patient's chart and labs.  Questions were answered to the patient's satisfaction.     Katharine Look A Sherrell Weir

## 2020-09-10 ENCOUNTER — Encounter (HOSPITAL_BASED_OUTPATIENT_CLINIC_OR_DEPARTMENT_OTHER): Payer: Self-pay | Admitting: Obstetrics and Gynecology

## 2020-09-10 LAB — SURGICAL PATHOLOGY

## 2020-09-26 ENCOUNTER — Other Ambulatory Visit: Payer: Self-pay | Admitting: Psychiatry

## 2020-09-26 DIAGNOSIS — F5105 Insomnia due to other mental disorder: Secondary | ICD-10-CM

## 2020-10-11 ENCOUNTER — Encounter: Payer: Self-pay | Admitting: Internal Medicine

## 2020-10-20 ENCOUNTER — Other Ambulatory Visit: Payer: Self-pay

## 2020-10-20 ENCOUNTER — Ambulatory Visit (INDEPENDENT_AMBULATORY_CARE_PROVIDER_SITE_OTHER): Payer: 59 | Admitting: Internal Medicine

## 2020-10-20 ENCOUNTER — Encounter: Payer: Self-pay | Admitting: Internal Medicine

## 2020-10-20 VITALS — BP 122/76 | HR 66 | Temp 98.1°F | Ht 68.2 in | Wt 187.2 lb

## 2020-10-20 DIAGNOSIS — F331 Major depressive disorder, recurrent, moderate: Secondary | ICD-10-CM | POA: Diagnosis not present

## 2020-10-20 DIAGNOSIS — Z0000 Encounter for general adult medical examination without abnormal findings: Secondary | ICD-10-CM | POA: Diagnosis not present

## 2020-10-20 NOTE — Patient Instructions (Addendum)
[employer] in  Vitamin D w/ K2  Health Maintenance, Female Adopting a healthy lifestyle and getting preventive care are important in promoting health and wellness. Ask your health care provider about:  The right schedule for you to have regular tests and exams.  Things you can do on your own to prevent diseases and keep yourself healthy. What should I know about diet, weight, and exercise? Eat a healthy diet  Eat a diet that includes plenty of vegetables, fruits, low-fat dairy products, and lean protein.  Do not eat a lot of foods that are high in solid fats, added sugars, or sodium.   Maintain a healthy weight Body mass index (BMI) is used to identify weight problems. It estimates body fat based on height and weight. Your health care provider can help determine your BMI and help you achieve or maintain a healthy weight. Get regular exercise Get regular exercise. This is one of the most important things you can do for your health. Most adults should:  Exercise for at least 150 minutes each week. The exercise should increase your heart rate and make you sweat (moderate-intensity exercise).  Do strengthening exercises at least twice a week. This is in addition to the moderate-intensity exercise.  Spend less time sitting. Even light physical activity can be beneficial. Watch cholesterol and blood lipids Have your blood tested for lipids and cholesterol at 45 years of age, then have this test every 5 years. Have your cholesterol levels checked more often if:  Your lipid or cholesterol levels are high.  You are older than 45 years of age.  You are at high risk for heart disease. What should I know about cancer screening? Depending on your health history and family history, you may need to have cancer screening at various ages. This may include screening for:  Breast cancer.  Cervical cancer.  Colorectal cancer.  Skin cancer.  Lung cancer. What should I know about  heart disease, diabetes, and high blood pressure? Blood pressure and heart disease  High blood pressure causes heart disease and increases the risk of stroke. This is more likely to develop in people who have high blood pressure readings, are of African descent, or are overweight.  Have your blood pressure checked: ? Every 3-5 years if you are 45-45 years of age. ? Every year if you are 5 years old or older. Diabetes Have regular diabetes screenings. This checks your fasting blood sugar level. Have the screening done:  Once every three years after age 45 if you are at a normal weight and have a low risk for diabetes.  More often and at a younger age if you are overweight or have a high risk for diabetes. What should I know about preventing infection? Hepatitis B If you have a higher risk for hepatitis B, you should be screened for this virus. Talk with your health care provider to find out if you are at risk for hepatitis B infection. Hepatitis C Testing is recommended for:  Everyone born from [birth year] through 1965.  Anyone with known risk factors for hepatitis C. Sexually transmitted infections (STIs)  Get screened for STIs, including gonorrhea and chlamydia, if: ? You are sexually active and are younger than 45 years of age. ? You are older than 45 years of age and your health care provider tells you that you are at risk for this type of infection. ? Your sexual activity has changed since you were last screened, and you are at increased  risk for chlamydia or gonorrhea. Ask your health care provider if you are at risk.  Ask your health care provider about whether you are at high risk for HIV. Your health care provider may recommend a prescription medicine to help prevent HIV infection. If you choose to take medicine to prevent HIV, you should first get tested for HIV. You should then be tested every 3 months for as long as you are taking the medicine. Pregnancy  If you are about to  stop having your period (premenopausal) and you may become pregnant, seek counseling before you get pregnant.  Take 400 to 800 micrograms (mcg) of folic acid every day if you become pregnant.  Ask for birth control (contraception) if you want to prevent pregnancy. Osteoporosis and menopause Osteoporosis is a disease in which the bones lose minerals and strength with aging. This can result in bone fractures. If you are 45 years old or older, or if you are at risk for osteoporosis and fractures, ask your health care provider if you should:  Be screened for bone loss.  Take a calcium or vitamin D supplement to lower your risk of fractures.  Be given hormone replacement therapy (HRT) to treat symptoms of menopause. Follow these instructions at home: Lifestyle  Do not use any products that contain nicotine or tobacco, such as cigarettes, e-cigarettes, and chewing tobacco. If you need help quitting, ask your health care provider.  Do not use street drugs.  Do not share needles.  Ask your health care provider for help if you need support or information about quitting drugs. Alcohol use  Do not drink alcohol if: ? Your health care provider tells you not to drink. ? You are pregnant, may be pregnant, or are planning to become pregnant.  If you drink alcohol: ? Limit how much you use to 0-1 drink a day. ? Limit intake if you are breastfeeding.  Be aware of how much alcohol is in your drink. In the [place] one drink equals one 12 oz bottle of beer (355 mL), one 5 oz glass of wine (148 mL), or one 1 oz glass of hard liquor (44 mL). General instructions  Schedule regular health, dental, and eye exams.  Stay current with your vaccines.  Tell your health care provider if: ? You often feel depressed. ? You have ever been abused or do not feel safe at home. Summary  Adopting a healthy lifestyle and getting preventive care are important in promoting health and wellness.  Follow your  health care provider's instructions about healthy diet, exercising, and getting tested or screened for diseases.  Follow your health care provider's instructions on monitoring your cholesterol and blood pressure. This information is not intended to replace advice given to you by your health care provider. Make sure you discuss any questions you have with your health care provider. Document Revised: 06/19/2018 Document Reviewed: 06/19/2018 Elsevier Patient Education  2021 [employer].

## 2020-10-20 NOTE — Progress Notes (Signed)
Rutherford Nail as a scribe for Maximino Greenland, MD.,have documented all relevant documentation on the behalf of Maximino Greenland, MD,as directed by  Maximino Greenland, MD while in the presence of Maximino Greenland, MD. This visit occurred during the SARS-CoV-2 public health emergency.  Safety protocols were in place, including screening questions prior to the visit, additional usage of staff PPE, and extensive cleaning of exam room while observing appropriate contact time as indicated for disinfecting solutions.  Subjective:     Patient ID: Kimberly Maldonado , female    DOB: 03/11/1976 , 45 y.o.   MRN: [id number]   Chief Complaint  Patient presents with  . Annual Exam    HPI  She is here today for a full physical examination. She is followed by Earnstine Regal, PA for her GYN exams last pap last month. She has no specific concerns or complaints at this time.     Past Medical History:  Diagnosis Date  . Allergy    takes Zyrtec and Singulair daily  . Anxiety   . Breast cancer ([place]) 11/15/2016   right breast CHEMO AND RAIATION DONE  . Carpal tunnel syndrome   . COVID 08/09/2020   asymptomatic  . Depression    takes Cymbalta daily  . Genetic testing 12/13/2016   Ms. Mcniel underwent genetic counseling and testing for hereditary cancer syndromes on 11/23/2016.Testing was performed through a custom panel that combined [employer]'s Common Hereditary Cancers Panel with [employer]'s Thyroid Cancer Panel. This custom panel includes analysis of the following 48 genes: APC, ATM, AXIN2, BARD1, BMPR1A, BRCA1, BRCA2, BRIP1, CDH1, CDKN2A, CHEK2, CTNNA1, DICER1, EPCAM, GREM1  . Insomnia    takes Trazodone and Ambien nightly  . Kidney donor   . Lymphedema of right arm    HX OF      Family History  Problem Relation Age of Onset  . COPD Maternal Grandmother   . Hypertension Maternal Grandfather   . Prostate cancer Maternal Grandfather 42  . Bone cancer Maternal Grandfather 56  . Diabetes Mother   .  Breast cancer Maternal Aunt 42  . Thyroid cancer Other 40       d.40s     Current Outpatient Medications:  .  acetaminophen (TYLENOL) 500 MG tablet, take 2 tablets po every 6 hours for 5 days then prn- post operative pain (first dose 6:30 pm today), Disp: 30 tablet, Rfl: 1 .  cetirizine (ZYRTEC) 10 MG tablet, Take 10 mg by mouth daily., Disp: , Rfl:  .  Cholecalciferol (VITAMIN D3) 125 MCG (5000 UT) TABS, Take 5,000 Units by mouth daily., Disp: , Rfl:  .  clobetasol ointment (TEMOVATE) 7.82 %, Apply 1 application topically every other day. Applied to scalp for alopecia, Disp: , Rfl:  .  exemestane (AROMASIN) 25 MG tablet, Take 25 mg by mouth daily., Disp: , Rfl:  .  ibuprofen (ADVIL) 600 MG tablet, take 2 tablets po pc every 6 hours for 5 days for post operative pain (first dose 10:30 pm. today), Disp: 30 tablet, Rfl: 1 .  levocetirizine (XYZAL) 5 MG tablet, Take 5 mg by mouth every other day as needed for allergies., Disp: , Rfl:  .  Multiple Vitamins-Minerals (MULTIVITAMIN WITH MINERALS) tablet, Take 1 tablet by mouth daily. 2 CHEWS DAILY, Disp: , Rfl:  .  pseudoephedrine-guaifenesin (MUCINEX D) 60-600 MG 12 hr tablet, Take 1 tablet by mouth 2 (two) times daily as needed for congestion., Disp: , Rfl:  .  traZODone (DESYREL) 50 MG tablet,  TAKE 1 TABLET BY MOUTH EVERYDAY AT BEDTIME, Disp: 30 tablet, Rfl: 1 .  tretinoin (RETIN-A) 0.1 % cream, Apply 1 application topically daily as needed (acne/skin blemishes.)., Disp: , Rfl:  .  triamcinolone (KENALOG) 0.1 %, Apply 1 application topically 2 (two) times daily as needed (skin irritation.)., Disp: , Rfl:  .  Triamcinolone Acetonide (KENALOG IJ), Inject 7.5 mg as directed every 8 (eight) weeks. For alopecia, Disp: , Rfl:  .  valACYclovir (VALTREX) 500 MG tablet, TAKE 1 TABLET BY ORAL ROUTE EVERY DAY AS NEEDED (Patient taking differently: Take 500 mg by mouth daily as needed (fever blisters.).), Disp: 30 tablet, Rfl: 0 .  venlafaxine XR (EFFEXOR-XR)  37.5 MG 24 hr capsule, TAKE 3 CAPSULES BY MOUTH EVERY MORNING WITH BREAKFAST, Disp: 90 capsule, Rfl: 1 .  goserelin (ZOLADEX) 10.8 MG injection, Inject 10.8 mg into the skin once., Disp: , Rfl:  .  oxyCODONE (ROXICODONE) 5 MG immediate release tablet, take 1 tablet po every 4 hours as needed for breakthrough post operative pain (Patient not taking: Reported on 10/20/2020), Disp: 20 tablet, Rfl: 0   No Known Allergies    The patient states she uses status post hysterectomy for birth control. Last LMP was No LMP recorded. (Menstrual status: Chemotherapy).. Negative for Dysmenorrhea. Negative for: breast discharge, breast lump(s), breast pain and breast self exam. Associated symptoms include abnormal vaginal bleeding. Pertinent negatives include abnormal bleeding (hematology), anxiety, decreased libido, depression, difficulty falling sleep, dyspareunia, history of infertility, nocturia, sexual dysfunction, sleep disturbances, urinary incontinence, urinary urgency, vaginal discharge and vaginal itching. Diet regular.The patient states her exercise level is  moderate. She is following [employer] fitness plan.   . The patient's tobacco use is:  Social History   Tobacco Use  Smoking Status Former Smoker  . Packs/day: 0.25  . Years: 2.00  . Pack years: 0.50  . Types: Cigarettes  . Start date: 78  . Quit date: 19  . Years since quitting: 25.3  Smokeless Tobacco Never Used  Tobacco Comment   quit smoking 21 yrs ago  . She has been exposed to passive smoke. The patient's alcohol use is:  Social History   Substance and Sexual Activity  Alcohol Use Yes   Comment: 4 glasses per weekEND    Review of Systems  Constitutional: Negative.  Negative for fatigue.  HENT: Negative.   Eyes: Negative.   Respiratory: Negative.   Cardiovascular: Negative.   Endocrine: Negative.  Negative for polydipsia, polyphagia and polyuria.  Genitourinary: Negative.   Musculoskeletal: Negative.   Skin: Negative.    Allergic/Immunologic: Negative.   Neurological: Negative.  Negative for dizziness and headaches.  Hematological: Negative.   Psychiatric/Behavioral: Negative.      Today's Vitals   10/20/20 0954  BP: 122/76  Pulse: 66  Temp: 98.1 F (36.7 C)  TempSrc: Oral  Weight: 187 lb 3.2 oz (84.9 kg)  Height: 5' 8.2" (1.732 m)   Body mass index is 28.3 kg/m.  Wt Readings from Last 3 Encounters:  10/20/20 187 lb 3.2 oz (84.9 kg)  09/09/20 190 lb 6.4 oz (86.4 kg)  09/07/20 190 lb (86.2 kg)   Objective:  Physical Exam Vitals reviewed.  Constitutional:      General: She is not in acute distress.    Appearance: Normal appearance.  HENT:     Head: Normocephalic and atraumatic.     Right Ear: Tympanic membrane, ear canal and external ear normal. There is no impacted cerumen.     Left Ear: Tympanic membrane,  ear canal and external ear normal. There is no impacted cerumen.     Nose: No congestion.     Comments: Masked     Mouth/Throat:     Comments: Masked  Eyes:     Extraocular Movements: Extraocular movements intact.     Conjunctiva/sclera: Conjunctivae normal.     Pupils: Pupils are equal, round, and reactive to light.  Cardiovascular:     Rate and Rhythm: Normal rate and regular rhythm.     Pulses: Normal pulses.     Heart sounds: Normal heart sounds. No murmur heard.   Pulmonary:     Effort: Pulmonary effort is normal. No respiratory distress.     Breath sounds: Normal breath sounds. No wheezing.  Chest:  Breasts:     Right: Absent.     Left: Absent.      Comments: S/p mastectomy b/l w/ reconstructive surgery Musculoskeletal:     Cervical back: Normal range of motion.  Skin:    General: Skin is warm and dry.     Capillary Refill: Capillary refill takes less than 2 seconds.  Neurological:     General: No focal deficit present.     Mental Status: She is alert and oriented to person, place, and time.     Cranial Nerves: No cranial nerve deficit.  Psychiatric:         Mood and Affect: Mood normal.        Behavior: Behavior normal.        Thought Content: Thought content normal.        Judgment: Judgment normal.         Assessment And Plan:     1. Routine general medical examination at health care facility Comments: A full exam was performed. Latest Oncology note reviewed. She is up to date with screening. Next colonoscopy due 2027. PATIENT IS ADVISED TO GET 30-45 MINUTES REGULAR EXERCISE NO LESS THAN FOUR TO FIVE DAYS PER WEEK - BOTH WEIGHTBEARING EXERCISES AND AEROBIC ARE RECOMMENDED.  PATIENT IS ADVISED TO FOLLOW A HEALTHY DIET WITH AT LEAST SIX FRUITS/VEGGIES PER DAY, DECREASE INTAKE OF RED MEAT, AND TO INCREASE FISH INTAKE TO TWO DAYS PER WEEK.  MEATS/FISH SHOULD NOT BE FRIED, BAKED OR BROILED IS PREFERABLE.  IT IS ALSO IMPORTANT TO CUT BACK ON YOUR SUGAR INTAKE. PLEASE AVOID ANYTHING WITH ADDED SUGAR, CORN SYRUP OR OTHER SWEETENERS. IF YOU MUST USE A SWEETENER, YOU CAN TRY STEVIA. IT IS ALSO IMPORTANT TO AVOID ARTIFICIALLY SWEETENERS AND DIET BEVERAGES. LASTLY, I SUGGEST WEARING SPF 50 SUNSCREEN ON EXPOSED PARTS AND ESPECIALLY WHEN IN THE DIRECT SUNLIGHT FOR AN EXTENDED PERIOD OF TIME.  PLEASE AVOID FAST FOOD RESTAURANTS AND INCREASE YOUR WATER INTAKE.  - CMP14+EGFR - Hepatitis C antibody - Lipid panel  2. Major depressive disorder, recurrent episode, moderate ([place]) She reports stability on current meds. She will c/w Venlaxafine ER daily.   Patient was given opportunity to ask questions. Patient verbalized understanding of the plan and was able to repeat key elements of the plan. All questions were answered to their satisfaction.   I, Maximino Greenland, MD, have reviewed all documentation for this visit. The documentation on 10/20/20 for the exam, diagnosis, procedures, and orders are all accurate and complete.  THE PATIENT IS ENCOURAGED TO PRACTICE SOCIAL DISTANCING DUE TO THE COVID-19 PANDEMIC.

## 2020-10-21 ENCOUNTER — Encounter: Payer: Self-pay | Admitting: Internal Medicine

## 2020-10-21 LAB — CMP14+EGFR
ALT: 18 IU/L (ref 0–32)
AST: 19 IU/L (ref 0–40)
Albumin/Globulin Ratio: 1.7 (ref 1.2–2.2)
Albumin: 4.8 g/dL (ref 3.8–4.8)
Alkaline Phosphatase: 83 IU/L (ref 44–121)
BUN/Creatinine Ratio: 12 (ref 9–23)
BUN: 13 mg/dL (ref 6–24)
Bilirubin Total: 0.3 mg/dL (ref 0.0–1.2)
CO2: 23 mmol/L (ref 20–29)
Calcium: 10.2 mg/dL (ref 8.7–10.2)
Chloride: 104 mmol/L (ref 96–106)
Creatinine, Ser: 1.1 mg/dL — ABNORMAL HIGH (ref 0.57–1.00)
Globulin, Total: 2.9 g/dL (ref 1.5–4.5)
Glucose: 94 mg/dL (ref 65–99)
Potassium: 4.9 mmol/L (ref 3.5–5.2)
Sodium: 142 mmol/L (ref 134–144)
Total Protein: 7.7 g/dL (ref 6.0–8.5)
eGFR: 64 mL/min/{1.73_m2} (ref >59)

## 2020-10-21 LAB — LIPID PANEL
Chol/HDL Ratio: 2.3 ratio (ref 0.0–4.4)
Cholesterol, Total: 117 mg/dL (ref 100–199)
HDL: 50 mg/dL (ref >39)
LDL Chol Calc (NIH): 55 mg/dL (ref 0–99)
Triglycerides: 51 mg/dL (ref 0–149)
VLDL Cholesterol Cal: 12 mg/dL (ref 5–40)

## 2020-10-21 LAB — HEPATITIS C ANTIBODY: Hep C Virus Ab: <0.1 s/co ratio (ref 0.0–0.9)

## 2020-10-28 ENCOUNTER — Ambulatory Visit (INDEPENDENT_AMBULATORY_CARE_PROVIDER_SITE_OTHER): Payer: 59 | Admitting: Psychiatry

## 2020-10-28 ENCOUNTER — Encounter: Payer: Self-pay | Admitting: Psychiatry

## 2020-10-28 ENCOUNTER — Other Ambulatory Visit: Payer: Self-pay

## 2020-10-28 DIAGNOSIS — F3342 Major depressive disorder, recurrent, in full remission: Secondary | ICD-10-CM | POA: Diagnosis not present

## 2020-10-28 DIAGNOSIS — F331 Major depressive disorder, recurrent, moderate: Secondary | ICD-10-CM

## 2020-10-28 DIAGNOSIS — F411 Generalized anxiety disorder: Secondary | ICD-10-CM

## 2020-10-28 DIAGNOSIS — F5105 Insomnia due to other mental disorder: Secondary | ICD-10-CM | POA: Diagnosis not present

## 2020-10-28 MED ORDER — TRAZODONE HCL 50 MG PO TABS: ORAL_TABLET | ORAL | 3 refills | Status: DC

## 2020-10-28 MED ORDER — VENLAFAXINE HCL ER 37.5 MG PO CP24: ORAL_CAPSULE | ORAL | 3 refills | Status: DC

## 2020-10-28 NOTE — Progress Notes (Signed)
Kimberly Maldonado [id number] Jan 10, 1976 45 y.o.  Subjective:   Patient ID:  Kimberly Maldonado is a 45 y.o. (DOB 1975/08/26) female.  Chief Complaint:  Chief Complaint  Patient presents with  . Follow-up  . Major depressive disorder, recurrent episode, moderate ([place])    Anxiety Patient reports no confusion, decreased concentration, dizziness, nervous/anxious behavior or suicidal ideas.     [name] presents to the office today for follow-up of depression and anxiety.  seen January 2020.  She was doing well no meds were changed.  05/09/2019 visit without med changes and following noted: Still doing really good.  No unusual depressive episodes.  10/28/2020 appointment with the following noted: Awsome re mental health.  No depression or anxiety.  Sleep managed with 1/2 trazodone 50 mg tablets. Remains on Effexor 112.5 mg.  Doing great re: mood.  Patient reports stable mood and denies depressed or irritable moods.  Patient denies any recent difficulty with anxiety.  Patient denies difficulty with sleep initiation or maintenance except needs trazodone and Ambien both to sleep.  Had some night eating if stays up and she understands it's her fault. Denies appetite disturbance.  Patient reports that energy and motivation have been good overall except DT chemo.  Patient denies any difficulty with concentration.  Patient denies any suicidal ideation. Less hot flashes with Effexor.  Breast CA 2018 chemo study ongoing.  Off and on fatigue with it.  Finishes March.  Previous psychiatric medication trials include temazepam, Ambien CR, trazodone,  Wellbutrin, duloxetine, Lexapro, Viibryd, Effexor XR 112.5 mg daily  Review of Systems:  Review of Systems  Constitutional: Negative for fatigue.  Neurological: Negative for dizziness, tremors and weakness.  Psychiatric/Behavioral: Negative for agitation, behavioral problems, confusion, decreased concentration, dysphoric mood, hallucinations, self-injury,  sleep disturbance and suicidal ideas. The patient is not nervous/anxious and is not hyperactive.     Medications: I have reviewed the patient's current medications.  Current Outpatient Medications  Medication Sig Dispense Refill  . acetaminophen (TYLENOL) 500 MG tablet take 2 tablets po every 6 hours for 5 days then prn- post operative pain (first dose 6:30 pm today) 30 tablet 1  . cetirizine (ZYRTEC) 10 MG tablet Take 10 mg by mouth daily.    . Cholecalciferol (VITAMIN D3) 125 MCG (5000 UT) TABS Take 5,000 Units by mouth daily.    . clobetasol ointment (TEMOVATE) 3.14 % Apply 1 application topically every other day. Applied to scalp for alopecia    . exemestane (AROMASIN) 25 MG tablet Take 25 mg by mouth daily.    Marland Kitchen ibuprofen (ADVIL) 600 MG tablet take 2 tablets po pc every 6 hours for 5 days for post operative pain (first dose 10:30 pm. today) 30 tablet 1  . levocetirizine (XYZAL) 5 MG tablet Take 5 mg by mouth every other day as needed for allergies.    . Multiple Vitamins-Minerals (MULTIVITAMIN WITH MINERALS) tablet Take 1 tablet by mouth daily. 2 CHEWS DAILY    . pseudoephedrine-guaifenesin (MUCINEX D) 60-600 MG 12 hr tablet Take 1 tablet by mouth 2 (two) times daily as needed for congestion.    Marland Kitchen tretinoin (RETIN-A) 0.1 % cream Apply 1 application topically daily as needed (acne/skin blemishes.).    Marland Kitchen triamcinolone (KENALOG) 0.1 % Apply 1 application topically 2 (two) times daily as needed (skin irritation.).    Marland Kitchen Triamcinolone Acetonide (KENALOG IJ) Inject 7.5 mg as directed every 8 (eight) weeks. For alopecia    . valACYclovir (VALTREX) 500 MG tablet TAKE 1 TABLET BY ORAL ROUTE  EVERY DAY AS NEEDED (Patient taking differently: Take 500 mg by mouth daily as needed (fever blisters.).) 30 tablet 0  . goserelin (ZOLADEX) 10.8 MG injection Inject 10.8 mg into the skin once. (Patient not taking: Reported on 10/28/2020)    . oxyCODONE (ROXICODONE) 5 MG immediate release tablet take 1 tablet po  every 4 hours as needed for breakthrough post operative pain (Patient not taking: No sig reported) 20 tablet 0  . traZODone (DESYREL) 50 MG tablet TAKE 1 TABLET BY MOUTH EVERYDAY AT BEDTIME 90 tablet 3  . venlafaxine XR (EFFEXOR-XR) 37.5 MG 24 hr capsule TAKE 3 CAPSULES BY MOUTH EVERY MORNING WITH BREAKFAST 90 capsule 3   No current facility-administered medications for this visit.    Medication Side Effects: None  Allergies: No Known Allergies  Past Medical History:  Diagnosis Date  . Allergy    takes Zyrtec and Singulair daily  . Anxiety   . Breast cancer ([place]) 11/15/2016   right breast CHEMO AND RAIATION DONE  . Carpal tunnel syndrome   . COVID 08/09/2020   asymptomatic  . Depression    takes Cymbalta daily  . Genetic testing 12/13/2016   Ms. Mirando underwent genetic counseling and testing for hereditary cancer syndromes on 11/23/2016.Testing was performed through a custom panel that combined [employer]'s Common Hereditary Cancers Panel with [employer]'s Thyroid Cancer Panel. This custom panel includes analysis of the following 48 genes: APC, ATM, AXIN2, BARD1, BMPR1A, BRCA1, BRCA2, BRIP1, CDH1, CDKN2A, CHEK2, CTNNA1, DICER1, EPCAM, GREM1  . Insomnia    takes Trazodone and Ambien nightly  . Kidney donor   . Lymphedema of right arm    HX OF     Family History  Problem Relation Age of Onset  . COPD Maternal Grandmother   . Hypertension Maternal Grandfather   . Prostate cancer Maternal Grandfather 63  . Bone cancer Maternal Grandfather 66  . Diabetes Mother   . Breast cancer Maternal Aunt 42  . Thyroid cancer Other 40       d.35s    Social History   Socioeconomic History  . Marital status: Divorced    Spouse name: Not on file  . Number of children: Not on file  . Years of education: Not on file  . Highest education level: Not on file  Occupational History  . Not on file  Tobacco Use  . Smoking status: Former Smoker    Packs/day: 0.25    Years: 2.00    Pack years: 0.50     Types: Cigarettes    Start date: 1995    Quit date: 1997    Years since quitting: 25.3  . Smokeless tobacco: Never Used  . Tobacco comment: quit smoking 21 yrs ago  Vaping Use  . Vaping Use: Never used  Substance and Sexual Activity  . Alcohol use: Yes    Comment: 4 glasses per weekEND  . Drug use: No  . Sexual activity: Yes  Other Topics Concern  . Not on file  Social History Narrative  . Not on file   Social Determinants of Health   Financial Resource Strain: Not on file  Food Insecurity: Not on file  Transportation Needs: Not on file  Physical Activity: Not on file  Stress: Not on file  Social Connections: Not on file  Intimate Partner Violence: Not on file    Past Medical History, Surgical history, Social history, and Family history were reviewed and updated as appropriate.   Please see review of systems for further details  on the patient's review from today.   Objective:   Physical Exam:  There were no vitals taken for this visit.  Physical Exam Neurological:     Mental Status: She is alert and oriented to person, place, and time.     Cranial Nerves: No dysarthria.  Psychiatric:        Attention and Perception: Attention normal.        Mood and Affect: Mood normal. Mood is not anxious or depressed.        Speech: Speech normal.        Behavior: Behavior is cooperative.        Thought Content: Thought content normal. Thought content is not paranoid or delusional. Thought content does not include homicidal or suicidal ideation. Thought content does not include homicidal or suicidal plan.        Cognition and Memory: Cognition and memory normal.        Judgment: Judgment normal.     Lab Review:     Component Value Date/Time   NA 142 10/20/2020 1110   NA 139 11/23/2016 1530   K 4.9 10/20/2020 1110   K 4.0 11/23/2016 1530   CL 104 10/20/2020 1110   CO2 23 10/20/2020 1110   CO2 26 11/23/2016 1530   GLUCOSE 94 10/20/2020 1110   GLUCOSE 100 (H)  09/07/2020 0934   GLUCOSE 85 11/23/2016 1530   BUN 13 10/20/2020 1110   BUN 12.8 11/23/2016 1530   CREATININE 1.10 (H) 10/20/2020 1110   CREATININE 0.9 11/23/2016 1530   CALCIUM 10.2 10/20/2020 1110   CALCIUM 9.3 11/23/2016 1530   PROT 7.7 10/20/2020 1110   PROT 7.2 11/23/2016 1530   ALBUMIN 4.8 10/20/2020 1110   ALBUMIN 3.9 11/23/2016 1530   AST 19 10/20/2020 1110   AST 17 11/23/2016 1530   ALT 18 10/20/2020 1110   ALT 22 11/23/2016 1530   ALKPHOS 83 10/20/2020 1110   ALKPHOS 41 11/23/2016 1530   BILITOT 0.3 10/20/2020 1110   BILITOT 0.31 11/23/2016 1530   GFRNONAA >60 09/07/2020 0934   GFRAA >60 12/28/2019 0048       Component Value Date/Time   WBC 3.0 (L) 09/07/2020 0934   RBC 4.52 09/07/2020 0934   HGB 13.4 09/07/2020 0934   HGB 12.9 11/23/2016 1530   HCT 41.2 09/07/2020 0934   HCT 39.0 11/23/2016 1530   PLT 236 09/07/2020 0934   PLT 219 11/23/2016 1530   MCV 91.2 09/07/2020 0934   MCV 91.7 11/23/2016 1530   MCH 29.6 09/07/2020 0934   MCHC 32.5 09/07/2020 0934   RDW 15.4 09/07/2020 0934   RDW 14.0 11/23/2016 1530   LYMPHSABS 2.0 11/23/2016 1530   MONOABS 0.7 11/23/2016 1530   EOSABS 0.1 11/23/2016 1530   BASOSABS 0.0 11/23/2016 1530    No results found for: POCLITH, LITHIUM   No results found for: PHENYTOIN, PHENOBARB, VALPROATE, CBMZ   .res Assessment: Plan:    Recurrent major depression in full remission ([place])  Generalized anxiety disorder - Plan: venlafaxine XR (EFFEXOR-XR) 37.5 MG 24 hr capsule  Insomnia due to mental condition - Plan: traZODone (DESYREL) 50 MG tablet  Major depressive disorder, recurrent episode, moderate ([place]) - Plan: venlafaxine XR (EFFEXOR-XR) 37.5 MG 24 hr capsule   Jamika has had a good response to Effexor controlling anxiety and depression and trazodone and Ambien are required for her insomnia but that is also working well without side effects.  The Effexor is helping to suppress hot flashes associated with  chemo.  .  She  agrees she needs to continue the Effexor long-term because of a history of recurrent depression and anxiety.  Discussed serotonin withdrawal. Effexor XR 112.5 mg daily.  Supportive therapy on self-care associated with the stress of having cancer and also to help keep depression and anxiety under control.  Call us if she has any relapse symptoms.  She is done well managing this.  Sleep OK with low dose trazodone and off Ambien.  No med changes indicated  Follow-up prn.  She can see PCP unless relapses  Hiram Comber, MD, DFAPA  Please see After Visit Summary for patient specific instructions.  Future Appointments  Date Time Provider [place]  10/24/2021 10:20 AM Glendale Chard, MD [facility] None    No orders of the defined types were placed in this encounter.     -------------------------------

## 2020-11-29 ENCOUNTER — Other Ambulatory Visit: Payer: Self-pay

## 2020-11-30 ENCOUNTER — Other Ambulatory Visit: Payer: 59

## 2020-11-30 ENCOUNTER — Other Ambulatory Visit: Payer: Self-pay

## 2020-11-30 DIAGNOSIS — N289 Disorder of kidney and ureter, unspecified: Secondary | ICD-10-CM

## 2020-11-30 LAB — BMP8+EGFR
BUN/Creatinine Ratio: 12 (ref 9–23)
BUN: 13 mg/dL (ref 6–24)
CO2: 24 mmol/L (ref 20–29)
Calcium: 10.1 mg/dL (ref 8.7–10.2)
Chloride: 105 mmol/L (ref 96–106)
Creatinine, Ser: 1.07 mg/dL — ABNORMAL HIGH (ref 0.57–1.00)
Glucose: 93 mg/dL (ref 65–99)
Potassium: 4.5 mmol/L (ref 3.5–5.2)
Sodium: 143 mmol/L (ref 134–144)
eGFR: 66 mL/min/{1.73_m2} (ref >59)

## 2021-02-28 ENCOUNTER — Other Ambulatory Visit: Payer: Self-pay | Admitting: Psychiatry

## 2021-02-28 DIAGNOSIS — F411 Generalized anxiety disorder: Secondary | ICD-10-CM

## 2021-02-28 DIAGNOSIS — F331 Major depressive disorder, recurrent, moderate: Secondary | ICD-10-CM

## 2021-03-25 ENCOUNTER — Other Ambulatory Visit: Payer: Self-pay | Admitting: Psychiatry

## 2021-03-25 DIAGNOSIS — F331 Major depressive disorder, recurrent, moderate: Secondary | ICD-10-CM

## 2021-03-25 DIAGNOSIS — F411 Generalized anxiety disorder: Secondary | ICD-10-CM

## 2021-06-23 ENCOUNTER — Other Ambulatory Visit: Payer: Self-pay | Admitting: Psychiatry

## 2021-06-23 DIAGNOSIS — F411 Generalized anxiety disorder: Secondary | ICD-10-CM

## 2021-06-23 DIAGNOSIS — F331 Major depressive disorder, recurrent, moderate: Secondary | ICD-10-CM

## 2021-09-19 ENCOUNTER — Other Ambulatory Visit: Payer: Self-pay | Admitting: Psychiatry

## 2021-09-19 DIAGNOSIS — F331 Major depressive disorder, recurrent, moderate: Secondary | ICD-10-CM

## 2021-09-19 DIAGNOSIS — F411 Generalized anxiety disorder: Secondary | ICD-10-CM

## 2021-10-24 ENCOUNTER — Encounter: Payer: Self-pay | Admitting: Internal Medicine

## 2021-10-24 ENCOUNTER — Ambulatory Visit (INDEPENDENT_AMBULATORY_CARE_PROVIDER_SITE_OTHER): Payer: 59 | Admitting: Internal Medicine

## 2021-10-24 VITALS — BP 112/78 | HR 74 | Temp 98.0°F | Ht 67.6 in | Wt 191.0 lb

## 2021-10-24 DIAGNOSIS — F331 Major depressive disorder, recurrent, moderate: Secondary | ICD-10-CM | POA: Diagnosis not present

## 2021-10-24 DIAGNOSIS — R7309 Other abnormal glucose: Secondary | ICD-10-CM

## 2021-10-24 DIAGNOSIS — Z6829 Body mass index (BMI) 29.0-29.9, adult: Secondary | ICD-10-CM | POA: Diagnosis not present

## 2021-10-24 DIAGNOSIS — Z0000 Encounter for general adult medical examination without abnormal findings: Secondary | ICD-10-CM

## 2021-10-24 DIAGNOSIS — Z170 Estrogen receptor positive status [ER+]: Secondary | ICD-10-CM

## 2021-10-24 DIAGNOSIS — C50911 Malignant neoplasm of unspecified site of right female breast: Secondary | ICD-10-CM | POA: Diagnosis not present

## 2021-10-24 NOTE — Patient Instructions (Signed)

## 2021-10-24 NOTE — Progress Notes (Signed)
?Rich Brave [place],acting as a Education administrator for Maximino Greenland, MD.,have documented all relevant documentation on the behalf of Maximino Greenland, MD,as directed by  Maximino Greenland, MD while in the presence of Maximino Greenland, MD.  ?This visit occurred during the SARS-CoV-2 public health emergency.  Safety protocols were in place, including screening questions prior to the visit, additional usage of staff PPE, and extensive cleaning of exam room while observing appropriate contact time as indicated for disinfecting solutions. ? ?Subjective:  ?  ? Patient ID: Kimberly Maldonado , female    DOB: [birth year] , 46 y.o.   MRN: [id number] ? ? ?Chief Complaint  ?Patient presents with  ? Annual Exam  ? ? ?HPI ? ?She is here today for a full physical examination. She is followed by Earnstine Regal, PA for her GYN exams. She has no specific concerns or complaints at this time.  ?  ? ?Past Medical History:  ?Diagnosis Date  ? Allergy   ? takes Zyrtec and Singulair daily  ? Anxiety   ? Breast cancer ([place]) 11/15/2016  ? right breast CHEMO AND RAIATION DONE  ? Carpal tunnel syndrome   ? COVID 08/09/2020  ? asymptomatic  ? Depression   ? takes Cymbalta daily  ? Genetic testing 12/13/2016  ? Ms. Middendorf underwent genetic counseling and testing for hereditary cancer syndromes on 11/23/2016.Testing was performed through a custom panel that combined [employer]'s Common Hereditary Cancers Panel with [employer]'s Thyroid Cancer Panel. This custom panel includes analysis of the following 48 genes: APC, ATM, AXIN2, BARD1, BMPR1A, BRCA1, BRCA2, BRIP1, CDH1, CDKN2A, CHEK2, CTNNA1, DICER1, EPCAM, GREM1  ? Insomnia   ? takes Trazodone and Ambien nightly  ? Kidney donor   ? Lymphedema of right arm   ? HX OF   ?  ? ?Family History  ?Problem Relation Age of Onset  ? COPD Maternal Grandmother   ? Hypertension Maternal Grandfather   ? Prostate cancer Maternal Grandfather 24  ? Bone cancer Maternal Grandfather 37  ? Diabetes Mother   ? Breast cancer Maternal Aunt 42  ?  Thyroid cancer Other 40  ?     d.40s  ? ? ? ?Current Outpatient Medications:  ?  acetaminophen (TYLENOL) 500 MG tablet, take 2 tablets po every 6 hours for 5 days then prn- post operative pain (first dose 6:30 pm today), Disp: 30 tablet, Rfl: 1 ?  CALCIUM PO, Take 1 tablet by mouth daily at 12 noon., Disp: , Rfl:  ?  cetirizine (ZYRTEC) 10 MG tablet, Take 10 mg by mouth daily., Disp: , Rfl:  ?  Cholecalciferol (VITAMIN D3) 125 MCG (5000 UT) TABS, Take 5,000 Units by mouth daily., Disp: , Rfl:  ?  clobetasol ointment (TEMOVATE) 2.48 %, Apply 1 application topically every other day. Applied to scalp for alopecia, Disp: , Rfl:  ?  exemestane (AROMASIN) 25 MG tablet, Take 25 mg by mouth daily., Disp: , Rfl:  ?  ibuprofen (ADVIL) 600 MG tablet, take 2 tablets po pc every 6 hours for 5 days for post operative pain (first dose 10:30 pm. today), Disp: 30 tablet, Rfl: 1 ?  levocetirizine (XYZAL) 5 MG tablet, Take 5 mg by mouth every other day as needed for allergies., Disp: , Rfl:  ?  pseudoephedrine-guaifenesin (MUCINEX D) 60-600 MG 12 hr tablet, Take 1 tablet by mouth 2 (two) times daily as needed for congestion., Disp: , Rfl:  ?  traZODone (DESYREL) 50 MG tablet, TAKE 1 TABLET BY MOUTH EVERYDAY AT  BEDTIME, Disp: 90 tablet, Rfl: 3 ?  tretinoin (RETIN-A) 0.1 % cream, Apply 1 application topically daily as needed (acne/skin blemishes.)., Disp: , Rfl:  ?  triamcinolone (KENALOG) 0.1 %, Apply 1 application topically 2 (two) times daily as needed (skin irritation.)., Disp: , Rfl:  ?  Triamcinolone Acetonide (KENALOG IJ), Inject 7.5 mg as directed every 8 (eight) weeks. For alopecia, Disp: , Rfl:  ?  valACYclovir (VALTREX) 500 MG tablet, TAKE 1 TABLET BY ORAL ROUTE EVERY DAY AS NEEDED (Patient taking differently: Take 500 mg by mouth daily as needed (fever blisters.).), Disp: 30 tablet, Rfl: 0 ?  venlafaxine XR (EFFEXOR-XR) 37.5 MG 24 hr capsule, TAKE 3 CAPSULES BY MOUTH EVERY MORNING WITH BREAKFAST, Disp: 270 capsule, Rfl: 0   ? ?No Known Allergies  ? ? ?The patient states she uses status post hysterectomy for birth control. Last LMP was No LMP recorded. (Menstrual status: Chemotherapy).. Negative for Dysmenorrhea. Negative for: breast discharge, breast lump(s), breast pain and breast self exam. Associated symptoms include abnormal vaginal bleeding. Pertinent negatives include abnormal bleeding (hematology), anxiety, decreased libido, depression, difficulty falling sleep, dyspareunia, history of infertility, nocturia, sexual dysfunction, sleep disturbances, urinary incontinence, urinary urgency, vaginal discharge and vaginal itching. Diet regular.  ? . The patient's tobacco use is:  ?Social History  ? ?Tobacco Use  ?Smoking Status Former  ? Packs/day: 0.25  ? Years: 2.00  ? Pack years: 0.50  ? Types: Cigarettes  ? Start date: 51  ? Quit date: 1997  ? Years since quitting: 26.3  ?Smokeless Tobacco Never  ?Tobacco Comments  ? quit smoking 21 yrs ago  ?Marland Kitchen She has been exposed to passive smoke. The patient's alcohol use is:  ?Social History  ? ?Substance and Sexual Activity  ?Alcohol Use Yes  ? Comment: 4 glasses per weekEND  ? ? ?Review of Systems  ?Constitutional: Negative.   ?HENT: Negative.    ?Eyes: Negative.   ?Respiratory: Negative.    ?Cardiovascular: Negative.   ?Gastrointestinal: Negative.   ?Endocrine: Negative.   ?Genitourinary: Negative.   ?Musculoskeletal: Negative.   ?Skin: Negative.   ?Allergic/Immunologic: Negative.   ?Neurological: Negative.   ?Hematological: Negative.   ?Psychiatric/Behavioral: Negative.     ? ?Today's Vitals  ? 10/24/21 0957  ?BP: 112/78  ?Pulse: 74  ?Temp: 98 ?F (36.7 ?C)  ?Weight: 191 lb (86.6 kg)  ?Height: 5' 7.6" (1.717 m)  ?PainSc: 0-No pain  ? ?Body mass index is 29.39 kg/m?.  ?Wt Readings from Last 3 Encounters:  ?10/24/21 191 lb (86.6 kg)  ?10/20/20 187 lb 3.2 oz (84.9 kg)  ?09/09/20 190 lb 6.4 oz (86.4 kg)  ?  ?Objective:  ?Physical Exam ?Vitals and nursing note reviewed.  ?Constitutional:   ?    Appearance: Normal appearance.  ?HENT:  ?   Head: Normocephalic and atraumatic.  ?   Right Ear: Tympanic membrane, ear canal and external ear normal.  ?   Left Ear: Tympanic membrane, ear canal and external ear normal.  ?   Nose: Nose normal.  ?   Mouth/Throat:  ?   Mouth: Mucous membranes are moist.  ?   Pharynx: Oropharynx is clear.  ?Eyes:  ?   Extraocular Movements: Extraocular movements intact.  ?   Conjunctiva/sclera: Conjunctivae normal.  ?   Pupils: Pupils are equal, round, and reactive to light.  ?Cardiovascular:  ?   Rate and Rhythm: Normal rate and regular rhythm.  ?   Pulses: Normal pulses.  ?   Heart sounds: Normal heart  sounds.  ?Pulmonary:  ?   Effort: Pulmonary effort is normal.  ?   Breath sounds: Normal breath sounds.  ?Chest:  ?   Comments: Healed surgical scars b/l ?S/p mastectomy w/ reconstructive surgery ?Abdominal:  ?   General: Abdomen is flat. Bowel sounds are normal.  ?   Palpations: Abdomen is soft.  ?Genitourinary: ?   Comments: deferred ?Musculoskeletal:     ?   General: Normal range of motion.  ?   Cervical back: Normal range of motion and neck supple.  ?Skin: ?   General: Skin is warm and dry.  ?Neurological:  ?   General: No focal deficit present.  ?   Mental Status: She is alert and oriented to person, place, and time.  ?Psychiatric:     ?   Mood and Affect: Mood normal.     ?   Behavior: Behavior normal.  ?  ? ?   ?Assessment And Plan:  ?   ?1. Encounter for general adult medical examination w/o abnormal findings ?Comments: A full exam was performed. She is up to date with CRC screening. I will refer her back to Earnstine Regal, PA at her new location for GYN care. Recent labs from [facility] reviewed in [place]. PATIENT IS ADVISED TO GET 30-45 MINUTES REGULAR EXERCISE NO LESS THAN FOUR TO FIVE DAYS PER WEEK - BOTH WEIGHTBEARING EXERCISES AND AEROBIC ARE RECOMMENDED.  PATIENT IS ADVISED TO FOLLOW A HEALTHY DIET WITH AT LEAST SIX FRUITS/VEGGIES PER DAY, DECREASE INTAKE OF RED  MEAT, AND TO INCREASE FISH INTAKE TO TWO DAYS PER WEEK.  MEATS/FISH SHOULD NOT BE FRIED, BAKED OR BROILED IS PREFERABLE.  IT IS ALSO IMPORTANT TO CUT BACK ON YOUR SUGAR INTAKE. PLEASE AVOID ANYTHING WITH A

## 2021-10-25 LAB — LIPID PANEL
Chol/HDL Ratio: 1.9 ratio (ref 0.0–4.4)
Cholesterol, Total: 126 mg/dL (ref 100–199)
HDL: 68 mg/dL (ref >39)
LDL Chol Calc (NIH): 43 mg/dL (ref 0–99)
Triglycerides: 73 mg/dL (ref 0–149)
VLDL Cholesterol Cal: 15 mg/dL (ref 5–40)

## 2021-10-25 LAB — HEMOGLOBIN A1C
Est. average glucose Bld gHb Est-mCnc: 128 mg/dL
Hgb A1c MFr Bld: 6.1 % — ABNORMAL HIGH (ref 4.8–5.6)

## 2021-12-19 ENCOUNTER — Other Ambulatory Visit: Payer: Self-pay | Admitting: Psychiatry

## 2021-12-19 DIAGNOSIS — F411 Generalized anxiety disorder: Secondary | ICD-10-CM

## 2021-12-19 DIAGNOSIS — F331 Major depressive disorder, recurrent, moderate: Secondary | ICD-10-CM

## 2022-01-30 LAB — HM MAMMOGRAPHY: HM Mammogram: ABNORMAL — AB (ref 0–4)

## 2022-02-15 ENCOUNTER — Encounter: Payer: Self-pay | Admitting: Plastic Surgery

## 2022-02-15 ENCOUNTER — Ambulatory Visit (INDEPENDENT_AMBULATORY_CARE_PROVIDER_SITE_OTHER): Payer: Self-pay | Admitting: Plastic Surgery

## 2022-02-15 VITALS — BP 145/87 | HR 56 | Ht 69.0 in | Wt 190.4 lb

## 2022-02-15 DIAGNOSIS — Z411 Encounter for cosmetic surgery: Secondary | ICD-10-CM

## 2022-02-15 NOTE — Progress Notes (Signed)
Referring Provider Glendale Chard, MD [address] [place],  [place] [address]   CC:  Possible facial filler for cheek hollowing   Kimberly Maldonado is an 46 y.o. female.  HPI: Patient is 46 years old who notes that she has lost significant weight and wanted to discuss possible facial filler for cheek hollowing.    No Known Allergies  Outpatient Encounter Medications as of 02/15/2022  Medication Sig   acetaminophen (TYLENOL) 500 MG tablet take 2 tablets po every 6 hours for 5 days then prn- post operative pain (first dose 6:30 pm today)   CALCIUM PO Take 1 tablet by mouth daily at 12 noon.   cetirizine (ZYRTEC) 10 MG tablet Take 10 mg by mouth daily.   Cholecalciferol (VITAMIN D3) 125 MCG (5000 UT) TABS Take 5,000 Units by mouth daily.   clobetasol ointment (TEMOVATE) 9.93 % Apply 1 application topically every other day. Applied to scalp for alopecia   exemestane (AROMASIN) 25 MG tablet Take 25 mg by mouth daily.   ibuprofen (ADVIL) 600 MG tablet take 2 tablets po pc every 6 hours for 5 days for post operative pain (first dose 10:30 pm. today)   levocetirizine (XYZAL) 5 MG tablet Take 5 mg by mouth every other day as needed for allergies.   pseudoephedrine-guaifenesin (MUCINEX D) 60-600 MG 12 hr tablet Take 1 tablet by mouth 2 (two) times daily as needed for congestion.   traZODone (DESYREL) 50 MG tablet TAKE 1 TABLET BY MOUTH EVERYDAY AT BEDTIME   tretinoin (RETIN-A) 0.1 % cream Apply 1 application topically daily as needed (acne/skin blemishes.).   triamcinolone (KENALOG) 0.1 % Apply 1 application topically 2 (two) times daily as needed (skin irritation.).   Triamcinolone Acetonide (KENALOG IJ) Inject 7.5 mg as directed every 8 (eight) weeks. For alopecia   valACYclovir (VALTREX) 500 MG tablet TAKE 1 TABLET BY ORAL ROUTE EVERY DAY AS NEEDED (Patient taking differently: Take 500 mg by mouth daily as needed (fever blisters.).)   venlafaxine XR (EFFEXOR-XR) 37.5 MG 24 hr  capsule TAKE 3 CAPSULES BY MOUTH EVERY DAY IN THE MORNING WITH BREAKFAST   No facility-administered encounter medications on file as of 02/15/2022.     Past Medical History:  Diagnosis Date   Allergy    takes Zyrtec and Singulair daily   Anxiety    Breast cancer ([place]) 11/15/2016   right breast CHEMO AND RAIATION DONE   Carpal tunnel syndrome    COVID 08/09/2020   asymptomatic   Depression    takes Cymbalta daily   Genetic testing 12/13/2016   Ms. Sanzone underwent genetic counseling and testing for hereditary cancer syndromes on 11/23/2016.Testing was performed through a custom panel that combined [employer]'s Common Hereditary Cancers Panel with [employer]'s Thyroid Cancer Panel. This custom panel includes analysis of the following 48 genes: APC, ATM, AXIN2, BARD1, BMPR1A, BRCA1, BRCA2, BRIP1, CDH1, CDKN2A, CHEK2, CTNNA1, DICER1, EPCAM, GREM1   Insomnia    takes Trazodone and Ambien nightly   Kidney donor    Lymphedema of right arm    HX OF     Past Surgical History:  Procedure Laterality Date   BREAST BIOPSY Right 11/15/2016   Procedure: RIGHT BREAST EXCISIONAL BIOPSY;  Surgeon: [name], MD;  Location: [facility];  Service: General;  Laterality: Right;   BREAST LUMPECTOMY Right 11/15/2016   BREAST RECONSTRUCTION  05/2020   DEEP FLAP   COLONOSCOPY  05/2019   KIDNEY DONATION Left 2005   MASTECTOMY Bilateral 05/03/2017   with  17 lymph nodes removed [place]   ROBOTIC ASSISTED TOTAL HYSTERECTOMY WITH BILATERAL SALPINGO OOPHERECTOMY Bilateral 09/09/2020   Procedure: XI ROBOTIC ASSISTED TOTAL HYSTERECTOMY WITH BILATERAL SALPINGO OOPHORECTOMY;  Surgeon: Delsa Bern, MD;  Location: [facility];  Service: Gynecology;  Laterality: Bilateral;    Family History  Problem Relation Age of Onset   COPD Maternal Grandmother    Hypertension Maternal Grandfather    Prostate cancer Maternal Grandfather 3   Bone cancer Maternal Grandfather 72   Diabetes Mother    Breast cancer Maternal  Aunt 42   Thyroid cancer Other 40       d.4s    Social History   Social History Narrative   Not on file     Review of Systems General: Denies fevers, chills, weight loss CV: Denies chest pain, shortness of breath, palpitations   Physical Exam    02/15/2022   10:48 AM 10/24/2021    9:57 AM 10/20/2020    9:54 AM  Vitals with BMI  Height 5' 9"  5' 7.6" 5' 8.2"  Weight 190 lbs 6 oz 191 lbs 187 lbs 3 oz  BMI 28.1 63.33 54.56  Systolic 256 389 373  Diastolic 87 78 76  Pulse 56 74 66    General:  No acute distress,  Alert and oriented, Non-Toxic, Normal speech and affect HEENT: Patient with aesthetic cheek hollowing, very minimal and does not appear to have an overly thin face  Assessment/Plan We discussed that I think that the volume of filler that would be necessary would be high.  I also think that her cheek structure is quite aesthetic.  We discussed that fat grafting to her cheeks would be an option but I do not think that it would be necessary to improve her appearance.  She is in agreement after this discussion.    Lennice Sites 02/15/2022, 11:28 AM

## 2022-03-18 ENCOUNTER — Other Ambulatory Visit: Payer: Self-pay | Admitting: Psychiatry

## 2022-03-18 DIAGNOSIS — F331 Major depressive disorder, recurrent, moderate: Secondary | ICD-10-CM

## 2022-03-18 DIAGNOSIS — F411 Generalized anxiety disorder: Secondary | ICD-10-CM

## 2022-05-04 ENCOUNTER — Other Ambulatory Visit: Payer: Self-pay | Admitting: Psychiatry

## 2022-05-04 DIAGNOSIS — F5105 Insomnia due to other mental disorder: Secondary | ICD-10-CM

## 2022-06-14 ENCOUNTER — Other Ambulatory Visit: Payer: Self-pay | Admitting: Psychiatry

## 2022-06-14 DIAGNOSIS — F411 Generalized anxiety disorder: Secondary | ICD-10-CM

## 2022-06-14 DIAGNOSIS — F331 Major depressive disorder, recurrent, moderate: Secondary | ICD-10-CM

## 2022-08-12 ENCOUNTER — Encounter (HOSPITAL_COMMUNITY): Payer: Self-pay

## 2022-08-12 ENCOUNTER — Emergency Department (HOSPITAL_COMMUNITY)
Admission: EM | Admit: 2022-08-12 | Discharge: 2022-08-12 | Disposition: A | Discharge status: Home / Self Care | Payer: 59 | Attending: Emergency Medicine | Admitting: Emergency Medicine

## 2022-08-12 ENCOUNTER — Emergency Department (HOSPITAL_COMMUNITY): Payer: 59

## 2022-08-12 ENCOUNTER — Other Ambulatory Visit: Payer: Self-pay

## 2022-08-12 DIAGNOSIS — K2970 Gastritis, unspecified, without bleeding: Secondary | ICD-10-CM | POA: Insufficient documentation

## 2022-08-12 DIAGNOSIS — Z853 Personal history of malignant neoplasm of breast: Secondary | ICD-10-CM | POA: Insufficient documentation

## 2022-08-12 DIAGNOSIS — R072 Precordial pain: Secondary | ICD-10-CM | POA: Diagnosis present

## 2022-08-12 DIAGNOSIS — K760 Fatty (change of) liver, not elsewhere classified: Secondary | ICD-10-CM

## 2022-08-12 DIAGNOSIS — R079 Chest pain, unspecified: Secondary | ICD-10-CM

## 2022-08-12 LAB — HEPATIC FUNCTION PANEL
ALT: 20 U/L (ref 0–44)
AST: 22 U/L (ref 15–41)
Albumin: 4.2 g/dL (ref 3.5–5.0)
Alkaline Phosphatase: 75 U/L (ref 38–126)
Bilirubin, Direct: 0.1 mg/dL (ref 0.0–0.2)
Indirect Bilirubin: 0.4 mg/dL (ref 0.3–0.9)
Total Bilirubin: 0.5 mg/dL (ref 0.3–1.2)
Total Protein: 7.1 g/dL (ref 6.5–8.1)

## 2022-08-12 LAB — BASIC METABOLIC PANEL
Anion gap: 8 (ref 5–15)
BUN: 19 mg/dL (ref 6–20)
CO2: 23 mmol/L (ref 22–32)
Calcium: 9 mg/dL (ref 8.9–10.3)
Chloride: 106 mmol/L (ref 98–111)
Creatinine, Ser: 0.97 mg/dL (ref 0.44–1.00)
GFR, Estimated: >60 mL/min (ref >60)
Glucose, Bld: 124 mg/dL — ABNORMAL HIGH (ref 70–99)
Potassium: 3.9 mmol/L (ref 3.5–5.1)
Sodium: 137 mmol/L (ref 135–145)

## 2022-08-12 LAB — CBC
HCT: 39.9 % (ref 36.0–46.0)
Hemoglobin: 13.1 g/dL (ref 12.0–15.0)
MCH: 30 pg (ref 26.0–34.0)
MCHC: 32.8 g/dL (ref 30.0–36.0)
MCV: 91.5 fL (ref 80.0–100.0)
Platelets: 227 10*3/uL (ref 150–400)
RBC: 4.36 MIL/uL (ref 3.87–5.11)
RDW: 14 % (ref 11.5–15.5)
WBC: 9.5 10*3/uL (ref 4.0–10.5)
nRBC: 0 % (ref 0.0–0.2)

## 2022-08-12 LAB — TROPONIN I (HIGH SENSITIVITY)
Troponin I (High Sensitivity): 5 ng/L (ref <18)
Troponin I (High Sensitivity): 6 ng/L (ref <18)

## 2022-08-12 LAB — HCG, QUANTITATIVE, PREGNANCY: hCG, Beta Chain, Quant, S: <1 m[IU]/mL (ref <5)

## 2022-08-12 LAB — I-STAT BETA HCG BLOOD, ED (MC, WL, AP ONLY): I-stat hCG, quantitative: 10.4 m[IU]/mL — ABNORMAL HIGH (ref <5)

## 2022-08-12 LAB — LIPASE, BLOOD: Lipase: 44 U/L (ref 11–51)

## 2022-08-12 MED ORDER — PANTOPRAZOLE SODIUM 40 MG IV SOLR
40.0000 mg | Freq: Once | INTRAVENOUS | Status: AC
  Administered 2022-08-12: 40 mg via INTRAVENOUS
  Filled 2022-08-12: qty 10

## 2022-08-12 MED ORDER — MAALOX MAX 400-400-40 MG/5ML PO SUSP: 5.0000 mL | Freq: Four times a day (QID) | ORAL | 0 refills | Status: DC | PRN

## 2022-08-12 MED ORDER — FAMOTIDINE 20 MG PO TABS: 20.0000 mg | ORAL_TABLET | Freq: Two times a day (BID) | ORAL | 0 refills | Status: DC

## 2022-08-12 MED ORDER — ALUM & MAG HYDROXIDE-SIMETH 200-200-20 MG/5ML PO SUSP
30.0000 mL | Freq: Once | ORAL | Status: AC
  Administered 2022-08-12: 30 mL via ORAL
  Filled 2022-08-12: qty 30

## 2022-08-12 NOTE — ED Provider Notes (Signed)
[place] EMERGENCY DEPARTMENT AT [facility] Provider Note   CSN: [id number] Arrival date & time: 08/12/22  1603     History  Chief Complaint  Patient presents with   Chest Pain    Kimberly Maldonado is a 47 y.o. female.  With PMH of breast cancer status post XRT, anxiety, depression, kidney donor presenting with chest pain.  Patient said around 3 AM she had some discomfort in her substernal region as well as in her right chest and epigastrium that she describes as a burning and intermittent contracting like pain.  Is intermittent in nature since 3 AM.  It feels like reflux although she does not have history of reflux.  She did eat ribs last night with coleslaw and has had some nausea but no vomiting or diaphoresis.  It is not worse with exertion or palpation.  She has no history of HTN, HLD, MI.  No history of PE or DVT.  Denies any associated shortness of breath, fevers, URI symptoms, leg pain or swelling.  No recent surgery or travel.  She is not on estrogen or progesterone medication.  No history of gallbladder disease.   Chest Pain      Home Medications Prior to Admission medications   Medication Sig Start Date End Date Taking? Authorizing Provider  alum & mag hydroxide-simeth (MAALOX MAX) 400-400-40 MG/5ML suspension Take 5 mLs by mouth every 6 (six) hours as needed for indigestion. 08/12/22  Yes Elgie Congo, MD  famotidine (PEPCID) 20 MG tablet Take 1 tablet (20 mg total) by mouth 2 (two) times daily. 08/12/22  Yes Elgie Congo, MD  acetaminophen (TYLENOL) 500 MG tablet take 2 tablets po every 6 hours for 5 days then prn- post operative pain (first dose 6:30 pm today) 09/09/20   Earnstine Regal, PA-C  CALCIUM PO Take 1 tablet by mouth daily at 12 noon.    [provider]  cetirizine (ZYRTEC) 10 MG tablet Take 10 mg by mouth daily.    [provider]  Cholecalciferol (VITAMIN D3) 125 MCG (5000 UT) TABS Take 5,000 Units by mouth daily.    [provider]  clobetasol ointment (TEMOVATE) 9.02 % Apply 1 application topically every other day. Applied to scalp for alopecia 06/28/20   [provider]  exemestane (AROMASIN) 25 MG tablet Take 25 mg by mouth daily. 07/15/18   [provider]  ibuprofen (ADVIL) 600 MG tablet take 2 tablets po pc every 6 hours for 5 days for post operative pain (first dose 10:30 pm. today) 09/09/20   Earnstine Regal, PA-C  levocetirizine (XYZAL) 5 MG tablet Take 5 mg by mouth every other day as needed for allergies.    [provider]  pseudoephedrine-guaifenesin (MUCINEX D) 60-600 MG 12 hr tablet Take 1 tablet by mouth 2 (two) times daily as needed for congestion.    [provider]  traZODone (DESYREL) 50 MG tablet TAKE 1 TABLET BY MOUTH EVERYDAY AT BEDTIME 05/04/22   Cottle, Billey Co., MD  tretinoin (RETIN-A) 0.1 % cream Apply 1 application topically daily as needed (acne/skin blemishes.).    [provider]  triamcinolone (KENALOG) 0.1 % Apply 1 application topically 2 (two) times daily as needed (skin irritation.).    [provider]  Triamcinolone Acetonide (KENALOG IJ) Inject 7.5 mg as directed every 8 (eight) weeks. For alopecia    [provider]  valACYclovir (VALTREX) 500 MG tablet TAKE 1 TABLET BY ORAL ROUTE EVERY DAY AS NEEDED Patient  taking differently: Take 500 mg by mouth daily as needed (fever blisters.). 06/10/18   Glendale Chard, MD  venlafaxine XR (EFFEXOR-XR) 37.5 MG 24 hr capsule TAKE 3 CAPSULES BY MOUTH EVERY DAY IN THE MORNING WITH BREAKFAST 06/14/22   Cottle, Billey Co., MD      Allergies    Patient has no known allergies.    Review of Systems   Review of Systems  Cardiovascular:  Positive for chest pain.    Physical Exam Updated Vital Signs BP (!) 136/93   Pulse 66   Temp 98.5 F (36.9 C) (Oral)   Resp 18   Ht '5\' 9"'$  (1.753 m)   Wt 84.8 kg   LMP 12/08/2016   SpO2 97%   BMI 27.62 kg/m  Physical  Exam Constitutional: Alert and oriented. Well appearing and in no distress. Eyes: Conjunctivae are normal. ENT      Head: Normocephalic and atraumatic.      Nose: No congestion.      Mouth/Throat: Mucous membranes are moist.      Neck: No stridor. Cardiovascular: S1, S2, regular rate, warm well-perfused Respiratory: Normal respiratory effort. Breath sounds are normal.  O2 sat 99 on RA Gastrointestinal: Soft and mild epigastrium and right upper quadrant tenderness no rebound or guarding, not peritonitic Musculoskeletal: Normal range of motion in all extremities.      Right lower leg: No tenderness or edema.      Left lower leg: No tenderness or edema. Neurologic: Normal speech and language.  No facial droop.  Moving all extremities equally.  Sensation grossly intact.  No gross focal neurologic deficits are appreciated. Skin: Skin is warm, dry and intact. No rash noted. Psychiatric: Mood and affect are normal. Speech and behavior are normal.  ED Results / Procedures / Treatments   Labs (all labs ordered are listed, but only abnormal results are displayed) Labs Reviewed  BASIC METABOLIC PANEL - Abnormal; Notable for the following components:      Result Value   Glucose, Bld 124 (*)    All other components within normal limits  I-STAT BETA HCG BLOOD, ED ([facility], [facility], [facility] ONLY) - Abnormal; Notable for the following components:   I-stat hCG, quantitative 10.4 (*)    All other components within normal limits  CBC  HCG, QUANTITATIVE, PREGNANCY  HEPATIC FUNCTION PANEL  LIPASE, BLOOD  POC URINE PREG, ED  TROPONIN I (HIGH SENSITIVITY)  TROPONIN I (HIGH SENSITIVITY)    EKG EKG Interpretation  Date/Time:  Saturday August 12 2022 16:10:37 EST Ventricular Rate:  61 PR Interval:  111 QRS Duration: 97 QT Interval:  431 QTC Calculation: 435 R Axis:   49 Text Interpretation: Sinus rhythm Borderline short PR interval No significant change since last tracing Confirmed by Georgina Snell  [phone]) on 08/12/2022 4:36:25 PM  Radiology US Abdomen Limited RUQ (LIVER/GB)  Result Date: 08/12/2022 CLINICAL DATA:  Abdominal pain starting early this morning. EXAM: ULTRASOUND ABDOMEN LIMITED RIGHT UPPER QUADRANT COMPARISON:  CT abdomen 11/02/2017 FINDINGS: Gallbladder: Gallbladder is contracted. No gallstones or wall thickening visualized. No sonographic Murphy sign noted by sonographer. Common bile duct: Diameter: 3 mm, normal Liver: Diffusely increased parenchymal echotexture suggesting fatty infiltration. No focal lesions are identified. Portal vein is patent on color Doppler imaging with normal direction of blood flow towards the liver. Other: Examination is technically limited due to overlying bowel gas. IMPRESSION: 1. Diffuse fatty infiltration of the liver. 2. No evidence of cholelithiasis or acute cholecystitis. Electronically Signed   By: Gwyndolyn Saxon  Gerilyn Nestle M.D.   On: 08/12/2022 18:17   DG Chest 2 View  Result Date: 08/12/2022 CLINICAL DATA:  Right-sided chest pain since this morning EXAM: CHEST - 2 VIEW COMPARISON:  12/28/2019 FINDINGS: The heart size and mediastinal contours are within normal limits. Both lungs are clear. The visualized skeletal structures are unremarkable. Surgical clips project over the breasts. IMPRESSION: No acute abnormality of the lungs. Electronically Signed   By: Delanna Ahmadi M.D.   On: 08/12/2022 16:32    Procedures Procedures  Remain on constant cardiac monitoring, sinus rhythm with normal rates.  Medications Ordered in ED Medications  pantoprazole (PROTONIX) injection 40 mg (40 mg Intravenous Given 08/12/22 1736)  alum & mag hydroxide-simeth (MAALOX/MYLANTA) 200-200-20 MG/5ML suspension 30 mL (30 mLs Oral Given 08/12/22 1736)    ED Course/ Medical Decision Making/ A&P Clinical Course as of 08/12/22 1919  Sat Aug 12, 2022  1822 US Abdomen Limited RUQ (LIVER/GB) [VB]  [phone] Reassessed patient who notes significant improvement in symptoms with GI cocktail and  tonic she has not had any recurrence of chest pain.  Workup reassuring as noted above.  Discussed tricked return precautions and discharged with Pepcid and Maalox.  She is in agreement with plan and safe for discharge at this time. [VB]    Clinical Course User Index [VB] Elgie Congo, MD   {                            Medical Decision Making Demetress Tift is a 47 y.o. female.  With PMH of breast cancer status post XRT, anxiety, depression, kidney donor presenting with chest pain.   Regarding the patient's chest pain, their HEART score is 2, low risk, and overall have an EKG which is reassuring and unchanged from previous. Will further evaluate for ACS with high-sensitivity troponin at least 3 hours from the patient's start of pain, initial high sensitive troponin 5 and repeat 6.  Doubt ACS.  Chest x-ray obtained which I personally reviewed, no evidence for pneumonia, pneumothorax, and pulmonary edema. I do not think PE because patient is PERC negative. I do not think aortic dissection as the patient is well-appearing, does not have ripping/tearing pain and equally has no pulse or neurologic deficits.   Patient's pain seems more likely consistent with GI related pathology such as PUD, esophageal spasm, gastritis.  Right upper quadrant ultrasound obtained which showed no cholelithiasis or cholecystitis.  Additionally LFTs and lipase unremarkable.  Reassessed patient who notes significant improvement in symptoms with GI cocktail and tonic she has not had any recurrence of chest pain.  Workup reassuring as noted above especially with flat hs trop and normal EKG with HEART score 2.  Discussed strict return precautions and discharged with Pepcid and Maalox.  She is in agreement with plan and safe for discharge at this time.    Amount and/or Complexity of Data Reviewed Labs: ordered. Radiology: ordered. Decision-making details documented in ED Course.  Risk OTC drugs. Prescription drug  management.    Final Clinical Impression(s) / ED Diagnoses Final diagnoses:  Chest pain, unspecified type  Gastritis without bleeding, unspecified chronicity, unspecified gastritis type    Rx / DC Orders ED Discharge Orders          Ordered    famotidine (PEPCID) 20 MG tablet  2 times daily        08/12/22 1914    alum & mag hydroxide-simeth (MAALOX MAX) 400-400-40 MG/5ML suspension  Every 6 hours PRN        08/12/22 1914              Elgie Congo, MD 08/12/22 1919

## 2022-08-12 NOTE — ED Triage Notes (Signed)
Patient said at 3am she began having centralized chest pain that moves to her right arm and right side of her neck. Nauseous.

## 2022-08-12 NOTE — Discharge Instructions (Signed)

## 2022-09-05 ENCOUNTER — Encounter (HOSPITAL_COMMUNITY): Payer: Self-pay

## 2022-09-05 ENCOUNTER — Other Ambulatory Visit: Payer: Self-pay

## 2022-09-05 ENCOUNTER — Emergency Department (HOSPITAL_COMMUNITY)
Admission: EM | Admit: 2022-09-05 | Discharge: 2022-09-05 | Disposition: A | Discharge status: Home / Self Care | Payer: 59 | Attending: Emergency Medicine | Admitting: Emergency Medicine

## 2022-09-05 DIAGNOSIS — R002 Palpitations: Secondary | ICD-10-CM | POA: Diagnosis present

## 2022-09-05 LAB — CBC
HCT: 39.6 % (ref 36.0–46.0)
Hemoglobin: 13 g/dL (ref 12.0–15.0)
MCH: 29.7 pg (ref 26.0–34.0)
MCHC: 32.8 g/dL (ref 30.0–36.0)
MCV: 90.4 fL (ref 80.0–100.0)
Platelets: 232 10*3/uL (ref 150–400)
RBC: 4.38 MIL/uL (ref 3.87–5.11)
RDW: 14 % (ref 11.5–15.5)
WBC: 4.4 10*3/uL (ref 4.0–10.5)
nRBC: 0 % (ref 0.0–0.2)

## 2022-09-05 LAB — BASIC METABOLIC PANEL
Anion gap: 8 (ref 5–15)
BUN: 12 mg/dL (ref 6–20)
CO2: 22 mmol/L (ref 22–32)
Calcium: 9.4 mg/dL (ref 8.9–10.3)
Chloride: 107 mmol/L (ref 98–111)
Creatinine, Ser: 0.96 mg/dL (ref 0.44–1.00)
GFR, Estimated: >60 mL/min (ref >60)
Glucose, Bld: 87 mg/dL (ref 70–99)
Potassium: 3.9 mmol/L (ref 3.5–5.1)
Sodium: 137 mmol/L (ref 135–145)

## 2022-09-05 LAB — TROPONIN I (HIGH SENSITIVITY): Troponin I (High Sensitivity): 6 ng/L (ref <18)

## 2022-09-05 LAB — MAGNESIUM: Magnesium: 2.1 mg/dL (ref 1.7–2.4)

## 2022-09-05 NOTE — ED Provider Notes (Signed)
[place] AT [facility] Provider Note   CSN: [id number] Arrival date & time: 09/05/22  2108     History  Chief Complaint  Patient presents with   Palpitations    Kimberly Maldonado is a 47 y.o. female presented ED with palpitations.  She has noticed over the past few weeks worsening over the past week.  She has frequent skipped heartbeats.  Denies history of MI or coronary disease.  Denies caffeine consumption.  Says he drinks alcohol only on the weekends.  Denies smoking.  Denies history of syncope.  HPI     Home Medications Prior to Admission medications   Medication Sig Start Date End Date Taking? Authorizing Provider  acetaminophen (TYLENOL) 500 MG tablet take 2 tablets po every 6 hours for 5 days then prn- post operative pain (first dose 6:30 pm today) 09/09/20  Yes Florene Glen, [place], PA-C  alum & mag hydroxide-simeth (MAALOX MAX) [id number] MG/5ML suspension Take 5 mLs by mouth every 6 (six) hours as needed for indigestion. 08/12/22  Yes Elgie Congo, MD  CALCIUM PO Take 1 tablet by mouth daily at 12 noon.   Yes [provider]  cetirizine (ZYRTEC) 10 MG tablet Take 10 mg by mouth daily.   Yes [provider]  Cholecalciferol (VITAMIN D3) 125 MCG (5000 UT) TABS Take 5,000 Units by mouth daily.   Yes [provider]  clobetasol ointment (TEMOVATE) [id number] % Apply 1 application topically every other day. Applied to scalp for alopecia 06/28/20  Yes [provider]  exemestane (AROMASIN) 25 MG tablet Take 25 mg by mouth daily. 07/15/18  Yes [provider]  famotidine (PEPCID) 20 MG tablet Take 1 tablet (20 mg total) by mouth 2 (two) times daily. 08/12/22  Yes Elgie Congo, MD  levocetirizine (XYZAL) 5 MG tablet Take 5 mg by mouth every other day as needed for allergies.   Yes [provider]  pseudoephedrine-guaifenesin (MUCINEX D) 60-600 MG 12 hr tablet Take 1 tablet by mouth 2 (two) times daily as  needed for congestion.   Yes [provider]  traZODone (DESYREL) 50 MG tablet TAKE 1 TABLET BY MOUTH EVERYDAY AT BEDTIME Patient taking differently: Take 50 mg by mouth at bedtime. 05/04/22  Yes Cottle, Billey Co., MD  tretinoin (RETIN-A) 0.1 % cream Apply 1 application topically daily as needed (acne/skin blemishes.).   Yes [provider]  triamcinolone (KENALOG) 0.1 % Apply 1 application topically 2 (two) times daily as needed (skin irritation.).   Yes [provider]  Triamcinolone Acetonide (KENALOG IJ) Inject 7.5 mg as directed every 8 (eight) weeks. For alopecia   Yes [provider]  valACYclovir (VALTREX) 500 MG tablet TAKE 1 TABLET BY ORAL ROUTE EVERY DAY AS NEEDED Patient taking differently: Take 500 mg by mouth daily as needed (fever blisters.). 06/10/18  Yes Glendale Chard, MD  ibuprofen (ADVIL) 600 MG tablet take 2 tablets po pc every 6 hours for 5 days for post operative pain (first dose 10:30 pm. today) Patient not taking: Reported on 09/05/2022 09/09/20   Earnstine Regal, PA-C  venlafaxine XR (EFFEXOR-XR) 37.5 MG 24 hr capsule TAKE 3 CAPSULES BY MOUTH EVERY DAY IN THE MORNING WITH BREAKFAST Patient taking differently: 75 mg daily with breakfast. 06/14/22   Cottle, Billey Co., MD      Allergies    Patient has no known allergies.    Review of Systems   Review of Systems  Physical Exam Updated Vital Signs  BP (!) 140/59 (BP Location: Left Arm)   Pulse 61   Temp 98.6 F (37 C) (Oral)   Resp 16   Ht 5' 9.5" (1.765 m)   Wt 84.8 kg   LMP 12/08/2016   SpO2 99%   BMI 27.22 kg/m  Physical Exam Constitutional:      General: She is not in acute distress. HENT:     Head: Normocephalic and atraumatic.  Eyes:     Conjunctiva/sclera: Conjunctivae normal.     Pupils: Pupils are equal, round, and reactive to light.  Cardiovascular:     Rate and Rhythm: Normal rate and regular rhythm.     Pulses: Normal pulses.  Pulmonary:     Effort:  Pulmonary effort is normal. No respiratory distress.  Skin:    General: Skin is warm and dry.  Neurological:     General: No focal deficit present.     Mental Status: She is alert and oriented to person, place, and time. Mental status is at baseline.  Psychiatric:        Mood and Affect: Mood normal.        Behavior: Behavior normal.     ED Results / Procedures / Treatments   Labs (all labs ordered are listed, but only abnormal results are displayed) Labs Reviewed  BASIC METABOLIC PANEL  CBC  MAGNESIUM  TROPONIN I (HIGH SENSITIVITY)    EKG EKG Interpretation  Date/Time:  Tuesday September 05 2022 21:18:48 EST Ventricular Rate:  63 PR Interval:  122 QRS Duration: 125 QT Interval:  541 QTC Calculation: 554 R Axis:   25 Text Interpretation: Sinus rhythm  No sig change from Aug 12 2022 ecg Confirmed by Octaviano Glow [phone]) on 09/05/2022 9:42:59 PM  Radiology No results found.  Procedures Procedures    Medications Ordered in ED Medications - No data to display  ED Course/ Medical Decision Making/ A&P                             Medical Decision Making Amount and/or Complexity of Data Reviewed Labs: ordered.   Patient is here complaining of palpitations.  I personally reviewed and interpreted the patient's EKG which shows a sinus rhythm.  On telemetry which I reviewed she has occasional PVCs, no evidence of V. tach or high-grade arrhythmia.  Her personally reviewed and interpreted the patient's labs, which do not show any significant electrolyte abnormalities.  I think the patient is stable to follow-up with cardiology as an outpatient.  Given that she is symptomatic with his PVCs I think a referral to cardiology be reasonable.  The patient is in agreement with this plan.  I have a low suspicion for PE at this time and not see CT angiogram clinically indicated.  No indication for emergent x-ray imaging of the chest.        Final Clinical Impression(s) / ED  Diagnoses Final diagnoses:  Palpitations    Rx / DC Orders ED Discharge Orders     None         Wyvonnia Dusky, MD 09/05/22 2332

## 2022-09-05 NOTE — ED Triage Notes (Signed)
Pt reports with palpitations x [redacted] weeks along with feeling sluggish and tired.

## 2022-09-06 ENCOUNTER — Encounter: Payer: Self-pay | Admitting: Internal Medicine

## 2022-09-12 ENCOUNTER — Other Ambulatory Visit: Payer: Self-pay | Admitting: Psychiatry

## 2022-09-12 DIAGNOSIS — F411 Generalized anxiety disorder: Secondary | ICD-10-CM

## 2022-09-12 DIAGNOSIS — F331 Major depressive disorder, recurrent, moderate: Secondary | ICD-10-CM

## 2022-10-31 ENCOUNTER — Other Ambulatory Visit: Payer: Self-pay | Admitting: Psychiatry

## 2022-10-31 DIAGNOSIS — F5105 Insomnia due to other mental disorder: Secondary | ICD-10-CM

## 2022-11-01 NOTE — Telephone Encounter (Signed)
Not seen since April 2022. Dr. Jennelle Human said she could be seen by PCP.  Please call to see if she is going to follow by PCP or by Dr. Jennelle Human. Will need appt for [place] to continue to fill meds.

## 2022-11-02 NOTE — Telephone Encounter (Signed)
Lvm for patient to let us know if she wants to schedule

## 2022-11-08 ENCOUNTER — Encounter: Payer: 59 | Admitting: Internal Medicine

## 2022-11-08 NOTE — Progress Notes (Deleted)
Subjective:     Patient ID: Kimberly Maldonado , female    DOB: 11-10-1975 , 47 y.o.   MRN: [id number]   Chief Complaint  Patient presents with   Annual Exam    HPI  She is here today for a full physical examination. She is followed by Henreitta Leber, PA for her GYN exams. She has no specific concerns or complaints at this time.      Past Medical History:  Diagnosis Date   Allergy    takes Zyrtec and Singulair daily   Anxiety    Breast cancer (HCC) 11/15/2016   right breast CHEMO AND RAIATION DONE   Carpal tunnel syndrome    COVID 08/09/2020   asymptomatic   Depression    takes Cymbalta daily   Genetic testing 12/13/2016   Ms. Seawright underwent genetic counseling and testing for hereditary cancer syndromes on 11/23/2016.Testing was performed through a custom panel that combined [employer]'s Common Hereditary Cancers Panel with [employer]'s Thyroid Cancer Panel. This custom panel includes analysis of the following 48 genes: APC, ATM, AXIN2, BARD1, BMPR1A, BRCA1, BRCA2, BRIP1, CDH1, CDKN2A, CHEK2, CTNNA1, DICER1, EPCAM, GREM1   Insomnia    takes Trazodone and Ambien nightly   Kidney donor    Lymphedema of right arm    HX OF      Family History  Problem Relation Age of Onset   COPD Maternal Grandmother    Hypertension Maternal Grandfather    Prostate cancer Maternal Grandfather 41   Bone cancer Maternal Grandfather 45   Diabetes Mother    Breast cancer Maternal Aunt 44   Thyroid cancer Other 40       d.40s     Current Outpatient Medications:    acetaminophen (TYLENOL) 500 MG tablet, take 2 tablets po every 6 hours for 5 days then prn- post operative pain (first dose 6:30 pm today), Disp: 30 tablet, Rfl: 1   alum & mag hydroxide-simeth (MAALOX MAX) 400-400-40 MG/5ML suspension, Take 5 mLs by mouth every 6 (six) hours as needed for indigestion., Disp: 355 mL, Rfl: 0   CALCIUM PO, Take 1 tablet by mouth daily at 12 noon., Disp: , Rfl:    cetirizine (ZYRTEC) 10 MG tablet, Take 10 mg  by mouth daily., Disp: , Rfl:    Cholecalciferol (VITAMIN D3) 125 MCG (5000 UT) TABS, Take 5,000 Units by mouth daily., Disp: , Rfl:    clobetasol ointment (TEMOVATE) 0.05 %, Apply 1 application topically every other day. Applied to scalp for alopecia, Disp: , Rfl:    exemestane (AROMASIN) 25 MG tablet, Take 25 mg by mouth daily., Disp: , Rfl:    famotidine (PEPCID) 20 MG tablet, Take 1 tablet (20 mg total) by mouth 2 (two) times daily., Disp: 30 tablet, Rfl: 0   ibuprofen (ADVIL) 600 MG tablet, take 2 tablets po pc every 6 hours for 5 days for post operative pain (first dose 10:30 pm. today) (Patient not taking: Reported on 09/05/2022), Disp: 30 tablet, Rfl: 1   levocetirizine (XYZAL) 5 MG tablet, Take 5 mg by mouth every other day as needed for allergies., Disp: , Rfl:    pseudoephedrine-guaifenesin (MUCINEX D) 60-600 MG 12 hr tablet, Take 1 tablet by mouth 2 (two) times daily as needed for congestion., Disp: , Rfl:    traZODone (DESYREL) 50 MG tablet, TAKE 1 TABLET BY MOUTH EVERYDAY AT BEDTIME, Disp: 90 tablet, Rfl: 0   tretinoin (RETIN-A) 0.1 % cream, Apply 1 application topically daily as needed (acne/skin blemishes.)., Disp: ,  Rfl:    triamcinolone (KENALOG) 0.1 %, Apply 1 application topically 2 (two) times daily as needed (skin irritation.)., Disp: , Rfl:    Triamcinolone Acetonide (KENALOG IJ), Inject 7.5 mg as directed every 8 (eight) weeks. For alopecia, Disp: , Rfl:    valACYclovir (VALTREX) 500 MG tablet, TAKE 1 TABLET BY ORAL ROUTE EVERY DAY AS NEEDED (Patient taking differently: Take 500 mg by mouth daily as needed (fever blisters.).), Disp: 30 tablet, Rfl: 0   venlafaxine XR (EFFEXOR-XR) 37.5 MG 24 hr capsule, TAKE 3 CAPSULES BY MOUTH EVERY DAY IN THE MORNING WITH FOOD, Disp: 270 capsule, Rfl: 0   No Known Allergies    The patient states she uses {contraceptive methods:5051} for birth control. Last LMP was Patient's last menstrual period was 12/08/2016.Marland Kitchen  {Dysmenorrhea-menorrhagia:21918}. Negative for: breast discharge, breast lump(s), breast pain and breast self exam. Associated symptoms include abnormal vaginal bleeding. Pertinent negatives include abnormal bleeding (hematology), anxiety, decreased libido, depression, difficulty falling sleep, dyspareunia, history of infertility, nocturia, sexual dysfunction, sleep disturbances, urinary incontinence, urinary urgency, vaginal discharge and vaginal itching. Diet regular.The patient states her exercise level is    . The patient's tobacco use is:  Social History   Tobacco Use  Smoking Status Former   Packs/day: 0.25   Years: 2.00   Additional pack years: 0.00   Total pack years: 0.50   Types: Cigarettes   Start date: 89   Quit date: 1997   Years since quitting: 27.3  Smokeless Tobacco Never  Tobacco Comments   quit smoking 21 yrs ago  . She has been exposed to passive smoke. The patient's alcohol use is:  Social History   Substance and Sexual Activity  Alcohol Use Yes   Comment: 4 glasses per weekEND  . Additional information: Last pap ***, next one scheduled for ***.    Review of Systems  Constitutional: Negative.   HENT: Negative.    Eyes: Negative.   Respiratory: Negative.    Cardiovascular: Negative.   Gastrointestinal: Negative.   Endocrine: Negative.   Genitourinary: Negative.   Musculoskeletal: Negative.   Skin: Negative.   Allergic/Immunologic: Negative.   Neurological: Negative.   Hematological: Negative.   Psychiatric/Behavioral: Negative.       There were no vitals filed for this visit. There is no height or weight on file to calculate BMI.   Objective:  Physical Exam      Assessment And Plan:     1. Encounter for general adult medical examination w/o abnormal findings  2. Elevated glucose  3. Major depressive disorder, recurrent episode, moderate (HCC)     Patient was given opportunity to ask questions. Patient verbalized understanding of the  plan and was able to repeat key elements of the plan. All questions were answered to their satisfaction.   Coolidge Breeze, CMA   I, Coolidge Breeze, CMA, have reviewed all documentation for this visit. The documentation on 11/08/22 for the exam, diagnosis, procedures, and orders are all accurate and complete.   THE PATIENT IS ENCOURAGED TO PRACTICE SOCIAL DISTANCING DUE TO THE COVID-19 PANDEMIC.

## 2022-11-12 NOTE — Progress Notes (Signed)
NO SHOW   This encounter was created in error - please disregard. 

## 2022-11-29 ENCOUNTER — Encounter: Payer: Self-pay | Admitting: Internal Medicine

## 2022-11-29 ENCOUNTER — Ambulatory Visit (INDEPENDENT_AMBULATORY_CARE_PROVIDER_SITE_OTHER): Payer: 59 | Admitting: Internal Medicine

## 2022-11-29 VITALS — BP 118/84 | HR 69 | Temp 98.1°F | Ht 69.0 in | Wt 185.0 lb

## 2022-11-29 DIAGNOSIS — R7309 Other abnormal glucose: Secondary | ICD-10-CM

## 2022-11-29 DIAGNOSIS — Z0000 Encounter for general adult medical examination without abnormal findings: Secondary | ICD-10-CM | POA: Diagnosis not present

## 2022-11-29 DIAGNOSIS — F324 Major depressive disorder, single episode, in partial remission: Secondary | ICD-10-CM | POA: Diagnosis not present

## 2022-11-29 DIAGNOSIS — F5105 Insomnia due to other mental disorder: Secondary | ICD-10-CM

## 2022-11-29 DIAGNOSIS — Z6827 Body mass index (BMI) 27.0-27.9, adult: Secondary | ICD-10-CM | POA: Diagnosis not present

## 2022-11-29 DIAGNOSIS — F331 Major depressive disorder, recurrent, moderate: Secondary | ICD-10-CM

## 2022-11-29 MED ORDER — TRAZODONE HCL 50 MG PO TABS: ORAL_TABLET | ORAL | 0 refills | Status: DC

## 2022-11-29 NOTE — Patient Instructions (Signed)

## 2022-11-29 NOTE — Progress Notes (Unsigned)
I,Victoria T Hamilton,acting as a scribe for [name], MD.,have documented all relevant documentation on the behalf of [name], MD,as directed by  [name], MD while in the presence of [name], MD.   Subjective:     Patient ID: Kimberly Maldonado , female    DOB: 1976/05/21 , 47 y.o.   MRN: [id number]   Chief Complaint  Patient presents with   Annual Exam    HPI  She is here today for a full physical examination. She is followed by Henreitta Leber, PA for her GYN exams. She was last seen in 2023.  She has no specific concerns   She reports that she has started to wean herself off of Trazodone & Effexor. She states initially starting this process in February. She is now down to one Effexor capsule daily. She was previously taking 3 capsules, she admits that she may have experienced withdrawal symptoms when she decreased from 2 to 1 capsule.      Past Medical History:  Diagnosis Date   Allergy    takes Zyrtec and Singulair daily   Anxiety    Breast cancer (HCC) 11/15/2016   right breast CHEMO AND RAIATION DONE   Carpal tunnel syndrome    COVID 08/09/2020   asymptomatic   Depression    takes Cymbalta daily   Genetic testing 12/13/2016   Ms. Wojtkowiak underwent genetic counseling and testing for hereditary cancer syndromes on 11/23/2016.Testing was performed through a custom panel that combined [employer]'s Common Hereditary Cancers Panel with [employer]'s Thyroid Cancer Panel. This custom panel includes analysis of the following 48 genes: APC, ATM, AXIN2, BARD1, BMPR1A, BRCA1, BRCA2, BRIP1, CDH1, CDKN2A, CHEK2, CTNNA1, DICER1, EPCAM, GREM1   Insomnia    takes Trazodone and Ambien nightly   Kidney donor    Lymphedema of right arm    HX OF      Family History  Problem Relation Age of Onset   COPD Maternal Grandmother    Hypertension Maternal Grandfather    Prostate cancer Maternal Grandfather 79   Bone cancer Maternal Grandfather 60   Diabetes Mother    Breast  cancer Maternal Aunt 85   Thyroid cancer Other 40       d.40s     Current Outpatient Medications:    acetaminophen (TYLENOL) 500 MG tablet, take 2 tablets po every 6 hours for 5 days then prn- post operative pain (first dose 6:30 pm today), Disp: 30 tablet, Rfl: 1   CALCIUM PO, Take 1 tablet by mouth daily at 12 noon., Disp: , Rfl:    cetirizine (ZYRTEC) 10 MG tablet, Take 10 mg by mouth daily., Disp: , Rfl:    Cholecalciferol (VITAMIN D3) 125 MCG (5000 UT) TABS, Take 5,000 Units by mouth daily., Disp: , Rfl:    clobetasol ointment (TEMOVATE) 0.05 %, Apply 1 application topically every other day. Applied to scalp for alopecia, Disp: , Rfl:    exemestane (AROMASIN) 25 MG tablet, Take 25 mg by mouth daily., Disp: , Rfl:    levocetirizine (XYZAL) 5 MG tablet, Take 5 mg by mouth every other day as needed for allergies., Disp: , Rfl:    pseudoephedrine-guaifenesin (MUCINEX D) 60-600 MG 12 hr tablet, Take 1 tablet by mouth 2 (two) times daily as needed for congestion., Disp: , Rfl:    tretinoin (RETIN-A) 0.1 % cream, Apply 1 application topically daily as needed (acne/skin blemishes.)., Disp: , Rfl:    triamcinolone (KENALOG) 0.1 %, Apply 1 application topically  2 (two) times daily as needed (skin irritation.)., Disp: , Rfl:    Triamcinolone Acetonide (KENALOG IJ), Inject 7.5 mg as directed every 8 (eight) weeks. For alopecia, Disp: , Rfl:    venlafaxine XR (EFFEXOR-XR) 37.5 MG 24 hr capsule, TAKE 3 CAPSULES BY MOUTH EVERY DAY IN THE MORNING WITH FOOD, Disp: 270 capsule, Rfl: 0   alum & mag hydroxide-simeth (MAALOX MAX) 400-400-40 MG/5ML suspension, Take 5 mLs by mouth every 6 (six) hours as needed for indigestion. (Patient not taking: Reported on 11/29/2022), Disp: 355 mL, Rfl: 0   famotidine (PEPCID) 20 MG tablet, Take 1 tablet (20 mg total) by mouth 2 (two) times daily. (Patient not taking: Reported on 11/29/2022), Disp: 30 tablet, Rfl: 0   ibuprofen (ADVIL) 600 MG tablet, take 2 tablets po pc every  6 hours for 5 days for post operative pain (first dose 10:30 pm. today) (Patient not taking: Reported on 09/05/2022), Disp: 30 tablet, Rfl: 1   traZODone (DESYREL) 50 MG tablet, TAKE 1 TABLET BY MOUTH EVERYDAY AT BEDTIME, Disp: 90 tablet, Rfl: 0   valACYclovir (VALTREX) 500 MG tablet, TAKE 1 TABLET BY ORAL ROUTE EVERY DAY AS NEEDED (Patient taking differently: Take 500 mg by mouth daily as needed (fever blisters.).), Disp: 30 tablet, Rfl: 0   No Known Allergies    The patient states she uses {contraceptive methods:5051} for birth control. Last LMP was Patient's last menstrual period was 12/08/2016.Marland Kitchen {Dysmenorrhea-menorrhagia:21918}. Negative for: breast discharge, breast lump(s), breast pain and breast self exam. Associated symptoms include abnormal vaginal bleeding. Pertinent negatives include abnormal bleeding (hematology), anxiety, decreased libido, depression, difficulty falling sleep, dyspareunia, history of infertility, nocturia, sexual dysfunction, sleep disturbances, urinary incontinence, urinary urgency, vaginal discharge and vaginal itching. Diet regular.The patient states her exercise level is    . The patient's tobacco use is:  Social History   Tobacco Use  Smoking Status Former   Packs/day: 0.25   Years: 2.00   Additional pack years: 0.00   Total pack years: 0.50   Types: Cigarettes   Start date: 7   Quit date: 1997   Years since quitting: 27.4  Smokeless Tobacco Never  Tobacco Comments   quit smoking 21 yrs ago  . She has been exposed to passive smoke. The patient's alcohol use is:  Social History   Substance and Sexual Activity  Alcohol Use Yes   Comment: 4 glasses per weekEND  . Additional information: Last pap ***, next one scheduled for ***.    Review of Systems  Constitutional: Negative.   HENT: Negative.    Eyes: Negative.   Respiratory: Negative.    Cardiovascular: Negative.   Gastrointestinal: Negative.   Endocrine: Negative.   Genitourinary: Negative.    Musculoskeletal: Negative.   Skin: Negative.   Allergic/Immunologic: Negative.   Neurological: Negative.   Hematological: Negative.   Psychiatric/Behavioral: Negative.       Today's Vitals   11/29/22 0842  BP: 118/84  Pulse: 69  Temp: 98.1 F (36.7 C)  SpO2: 98%  Weight: 185 lb (83.9 kg)  Height: 5\' 9"  (1.753 m)   Body mass index is 27.32 kg/m.  Wt Readings from Last 3 Encounters:  11/29/22 185 lb (83.9 kg)  09/05/22 187 lb (84.8 kg)  08/12/22 187 lb (84.8 kg)    Objective:  Physical Exam Vitals and nursing note reviewed.  Constitutional:      Appearance: Normal appearance.  HENT:     Head: Normocephalic and atraumatic.     Right Ear: Tympanic membrane,  ear canal and external ear normal.     Left Ear: Tympanic membrane, ear canal and external ear normal.     Nose: Nose normal.     Mouth/Throat:     Mouth: Mucous membranes are moist.     Pharynx: Oropharynx is clear.  Eyes:     Extraocular Movements: Extraocular movements intact.     Conjunctiva/sclera: Conjunctivae normal.     Pupils: Pupils are equal, round, and reactive to light.  Cardiovascular:     Rate and Rhythm: Normal rate and regular rhythm.     Pulses: Normal pulses.     Heart sounds: Normal heart sounds.  Pulmonary:     Effort: Pulmonary effort is normal.     Breath sounds: Normal breath sounds.  Chest:  Breasts:    Right: Absent.     Left: Absent.     Comments: She is s/p b/l mastectomy Abdominal:     General: Abdomen is flat. Bowel sounds are normal.     Palpations: Abdomen is soft.  Genitourinary:    Comments: deferred Musculoskeletal:        General: Normal range of motion.     Cervical back: Normal range of motion and neck supple.  Skin:    General: Skin is warm and dry.  Neurological:     General: No focal deficit present.     Mental Status: She is alert and oriented to person, place, and time.  Psychiatric:        Mood and Affect: Mood normal.        Behavior: Behavior normal.        Assessment And Plan:     1. Encounter for general adult medical examination w/o abnormal findings Comments: A full exam was performed. I will check labs as below. - CBC - CMP14+EGFR - Lipid panel - TSH - Magnesium  2. Major depressive disorder with single episode, in partial remission (HCC) Comments: She has started to titrate off of Effexor. Pt advised to not decrease meds further until I have spoken with my pharmacist. I may need to switch to short-acting  3. 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 Advised to continue with trazodone nightly until further notice. She agrees to c/w treatment plan. - traZODone (DESYREL) 50 MG tablet; TAKE 1 TABLET BY MOUTH EVERYDAY AT BEDTIME  Dispense: 90 tablet; Refill: 0  4. Elevated glucose Comments: Prior labs reviewed, her a1c has been elevated in the past. I will recheck an a1c today, encouraged to limit her intake of sugary beverages/foods. - Hemoglobin A1c  5. Body mass index (BMI) 27.0-27.9, adult Comments: She is encouraged to aim for at least 150 minutes of exercise/week.    Return for 1 year HM. Patient was given opportunity to ask questions. Patient verbalized understanding of the plan and was able to repeat key elements of the plan. All questions were answered to their satisfaction.   I, [name], MD, have reviewed all documentation for this visit. The documentation on 11/29/22 for the exam, diagnosis, procedures, and orders are all accurate and complete.  THE PATIENT IS ENCOURAGED TO PRACTICE SOCIAL DISTANCING DUE TO THE COVID-19 PANDEMIC.

## 2022-11-30 ENCOUNTER — Encounter: Payer: Self-pay | Admitting: Internal Medicine

## 2022-11-30 LAB — CMP14+EGFR
ALT: 16 IU/L (ref 0–32)
AST: 15 IU/L (ref 0–40)
Albumin/Globulin Ratio: 2.1 (ref 1.2–2.2)
Albumin: 4.7 g/dL (ref 3.9–4.9)
Alkaline Phosphatase: 96 IU/L (ref 44–121)
BUN/Creatinine Ratio: 14 (ref 9–23)
BUN: 16 mg/dL (ref 6–24)
Bilirubin Total: 0.3 mg/dL (ref 0.0–1.2)
CO2: 22 mmol/L (ref 20–29)
Calcium: 10.1 mg/dL (ref 8.7–10.2)
Chloride: 103 mmol/L (ref 96–106)
Creatinine, Ser: 1.12 mg/dL — ABNORMAL HIGH (ref 0.57–1.00)
Globulin, Total: 2.2 g/dL (ref 1.5–4.5)
Glucose: 90 mg/dL (ref 70–99)
Potassium: 4.6 mmol/L (ref 3.5–5.2)
Sodium: 139 mmol/L (ref 134–144)
Total Protein: 6.9 g/dL (ref 6.0–8.5)
eGFR: 61 mL/min/{1.73_m2} (ref >59)

## 2022-11-30 LAB — CBC
Hematocrit: 42.4 % (ref 34.0–46.6)
Hemoglobin: 14.1 g/dL (ref 11.1–15.9)
MCH: 29.8 pg (ref 26.6–33.0)
MCHC: 33.3 g/dL (ref 31.5–35.7)
MCV: 90 fL (ref 79–97)
Platelets: 222 10*3/uL (ref 150–450)
RBC: 4.73 x10E6/uL (ref 3.77–5.28)
RDW: 13.2 % (ref 11.7–15.4)
WBC: 3.6 10*3/uL (ref 3.4–10.8)

## 2022-11-30 LAB — LIPID PANEL
Chol/HDL Ratio: 2 ratio (ref 0.0–4.4)
Cholesterol, Total: 127 mg/dL (ref 100–199)
HDL: 62 mg/dL (ref >39)
LDL Chol Calc (NIH): 54 mg/dL (ref 0–99)
Triglycerides: 49 mg/dL (ref 0–149)
VLDL Cholesterol Cal: 11 mg/dL (ref 5–40)

## 2022-11-30 LAB — MAGNESIUM: Magnesium: 2.1 mg/dL (ref 1.6–2.3)

## 2022-11-30 LAB — HEMOGLOBIN A1C
Est. average glucose Bld gHb Est-mCnc: 123 mg/dL
Hgb A1c MFr Bld: 5.9 % — ABNORMAL HIGH (ref 4.8–5.6)

## 2022-11-30 LAB — TSH: TSH: 1.51 u[IU]/mL (ref 0.450–4.500)

## 2022-12-05 ENCOUNTER — Encounter: Payer: Self-pay | Admitting: Internal Medicine

## 2023-01-17 ENCOUNTER — Encounter: Payer: Self-pay | Admitting: Internal Medicine

## 2023-01-17 ENCOUNTER — Ambulatory Visit (INDEPENDENT_AMBULATORY_CARE_PROVIDER_SITE_OTHER): Payer: 59 | Admitting: Internal Medicine

## 2023-01-17 VITALS — BP 122/78 | HR 75 | Temp 98.1°F | Ht 69.0 in | Wt 187.2 lb

## 2023-01-17 DIAGNOSIS — J069 Acute upper respiratory infection, unspecified: Secondary | ICD-10-CM | POA: Diagnosis not present

## 2023-01-17 MED ORDER — AZITHROMYCIN 250 MG PO TABS: ORAL_TABLET | ORAL | 0 refills | Status: AC

## 2023-01-17 MED ORDER — ALBUTEROL SULFATE HFA 108 (90 BASE) MCG/ACT IN AERS: 2.0000 | INHALATION_SPRAY | Freq: Four times a day (QID) | RESPIRATORY_TRACT | 2 refills | Status: DC | PRN

## 2023-01-17 MED ORDER — FLUCONAZOLE 150 MG PO TABS: ORAL_TABLET | ORAL | 0 refills | Status: DC

## 2023-01-17 NOTE — Progress Notes (Signed)
I,[name], CMA,acting as a Neurosurgeon for [name], MD.,have documented all relevant documentation on the behalf of [name], MD,as directed by  [name], MD while in the presence of [name], MD.  Subjective:  Patient ID: Kimberly Maldonado , female    DOB: 1975/12/12 , 47 y.o.   MRN: [id number]  Chief Complaint  Patient presents with   Cough    HPI  Patient presents today for cough.  She reports this initially started a month in a half ago.  However,her sx have worsened over the past week.  This past Friday & Saturday she started to have a lot of pressure in her face, resulting in her taking Mucinex. She has not had to use anymore Mucinex since then.  She admits having phlegm, with a green tint. She reports also feeling a throat tickle.  Denies fever, runny nose or sore / dry throat. She also denies ill contacts.      Past Medical History:  Diagnosis Date   Allergy    takes Zyrtec and Singulair daily   Anxiety    Breast cancer (HCC) 11/15/2016   right breast CHEMO AND RAIATION DONE   Carpal tunnel syndrome    COVID 08/09/2020   asymptomatic   Depression    takes Cymbalta daily   Genetic testing 12/13/2016   Ms. Rosencrans underwent genetic counseling and testing for hereditary cancer syndromes on 11/23/2016.Testing was performed through a custom panel that combined [employer]'s Common Hereditary Cancers Panel with [employer]'s Thyroid Cancer Panel. This custom panel includes analysis of the following 48 genes: APC, ATM, AXIN2, BARD1, BMPR1A, BRCA1, BRCA2, BRIP1, CDH1, CDKN2A, CHEK2, CTNNA1, DICER1, EPCAM, GREM1   Insomnia    takes Trazodone and Ambien nightly   Kidney donor    Lymphedema of right arm    HX OF      Family History  Problem Relation Age of Onset   COPD Maternal Grandmother    Hypertension Maternal Grandfather    Prostate cancer Maternal Grandfather 84   Bone cancer Maternal Grandfather 88   Diabetes Mother    Breast cancer Maternal Aunt 36    Thyroid cancer Other 40       d.40s     Current Outpatient Medications:    acetaminophen (TYLENOL) 500 MG tablet, take 2 tablets po every 6 hours for 5 days then prn- post operative pain (first dose 6:30 pm today), Disp: 30 tablet, Rfl: 1   albuterol (VENTOLIN HFA) 108 (90 Base) MCG/ACT inhaler, Inhale 2 puffs into the lungs every 6 (six) hours as needed for wheezing or shortness of breath., Disp: 18 g, Rfl: 2   CALCIUM PO, Take 1 tablet by mouth daily at 12 noon., Disp: , Rfl:    cetirizine (ZYRTEC) 10 MG tablet, Take 10 mg by mouth daily., Disp: , Rfl:    Cholecalciferol (VITAMIN D3) 125 MCG (5000 UT) TABS, Take 5,000 Units by mouth daily., Disp: , Rfl:    clobetasol ointment (TEMOVATE) 0.05 %, Apply 1 application topically every other day. Applied to scalp for alopecia, Disp: , Rfl:    exemestane (AROMASIN) 25 MG tablet, Take 25 mg by mouth daily., Disp: , Rfl:    fluconazole (DIFLUCAN) 150 MG tablet, One tab po today, repeat in 48 hours, Disp: 2 tablet, Rfl: 0   levocetirizine (XYZAL) 5 MG tablet, Take 5 mg by mouth every other day as needed for allergies., Disp: , Rfl:    pseudoephedrine-guaifenesin (MUCINEX D) 60-600 MG 12  hr tablet, Take 1 tablet by mouth 2 (two) times daily as needed for congestion., Disp: , Rfl:    traZODone (DESYREL) 50 MG tablet, TAKE 1 TABLET BY MOUTH EVERYDAY AT BEDTIME, Disp: 90 tablet, Rfl: 0   tretinoin (RETIN-A) 0.1 % cream, Apply 1 application topically daily as needed (acne/skin blemishes.)., Disp: , Rfl:    triamcinolone (KENALOG) 0.1 %, Apply 1 application topically 2 (two) times daily as needed (skin irritation.)., Disp: , Rfl:    Triamcinolone Acetonide (KENALOG IJ), Inject 7.5 mg as directed every 8 (eight) weeks. For alopecia, Disp: , Rfl:    valACYclovir (VALTREX) 500 MG tablet, TAKE 1 TABLET BY ORAL ROUTE EVERY DAY AS NEEDED (Patient taking differently: Take 500 mg by mouth daily as needed (fever blisters.).), Disp: 30 tablet, Rfl: 0   alum & mag  hydroxide-simeth (MAALOX MAX) 400-400-40 MG/5ML suspension, Take 5 mLs by mouth every 6 (six) hours as needed for indigestion. (Patient not taking: Reported on 11/29/2022), Disp: 355 mL, Rfl: 0   famotidine (PEPCID) 20 MG tablet, Take 1 tablet (20 mg total) by mouth 2 (two) times daily. (Patient not taking: Reported on 11/29/2022), Disp: 30 tablet, Rfl: 0   ibuprofen (ADVIL) 600 MG tablet, take 2 tablets po pc every 6 hours for 5 days for post operative pain (first dose 10:30 pm. today) (Patient not taking: Reported on 09/05/2022), Disp: 30 tablet, Rfl: 1   venlafaxine XR (EFFEXOR-XR) 37.5 MG 24 hr capsule, TAKE 3 CAPSULES BY MOUTH EVERY DAY IN THE MORNING WITH FOOD (Patient not taking: Reported on 01/17/2023), Disp: 270 capsule, Rfl: 0   No Known Allergies   Review of Systems  Constitutional: Negative.   HENT:  Positive for congestion. Negative for sneezing and sore throat.   Respiratory:  Positive for cough.   Cardiovascular: Negative.   Neurological: Negative.   Psychiatric/Behavioral: Negative.       Today's Vitals   01/17/23 0834  BP: 122/78  Pulse: 75  Temp: 98.1 F (36.7 C)  SpO2: 98%  Weight: 187 lb 3.2 oz (84.9 kg)  Height: 5\' 9"  (1.753 m)   Body mass index is 27.64 kg/m.  Wt Readings from Last 3 Encounters:  01/17/23 187 lb 3.2 oz (84.9 kg)  11/29/22 185 lb (83.9 kg)  09/05/22 187 lb (84.8 kg)     Objective:  Physical Exam Vitals and nursing note reviewed.  Constitutional:      Appearance: Normal appearance.  HENT:     Head: Normocephalic and atraumatic.  Eyes:     Extraocular Movements: Extraocular movements intact.  Cardiovascular:     Rate and Rhythm: Normal rate and regular rhythm.     Heart sounds: Normal heart sounds.  Pulmonary:     Effort: Pulmonary effort is normal.     Comments: She is able to speak in full sentences w/o labored breathing Skin:    General: Skin is warm.  Neurological:     General: No focal deficit present.     Mental Status: She is  alert.  Psychiatric:        Mood and Affect: Mood normal.        Behavior: Behavior normal.         Assessment And Plan:  Upper respiratory tract infection, unspecified type Assessment & Plan: I will send rx Zpak given the longevity of her symptoms. She is encouraged to take the full course and advised to avoid dairy products. Also advised to drink at least one hot beverage daily. May also  benefit from OTC antihistamine therapy if cough persists. Will send diflucan, she states she gets yeast infections w/ all abx.    Other orders -     Azithromycin; Take 2 tablets (500 mg) on  Day 1,  followed by 1 tablet (250 mg) once daily on Days 2 through 5.  Dispense: 6 each; Refill: 0 -     Fluconazole; One tab po today, repeat in 48 hours  Dispense: 2 tablet; Refill: 0 -     Albuterol Sulfate HFA; Inhale 2 puffs into the lungs every 6 (six) hours as needed for wheezing or shortness of breath.  Dispense: 18 g; Refill: 2    Return if symptoms worsen or fail to improve.  Patient was given opportunity to ask questions. Patient verbalized understanding of the plan and was able to repeat key elements of the plan. All questions were answered to their satisfaction.   I, [name], MD, have reviewed all documentation for this visit. The documentation on 01/17/23 for the exam, diagnosis, procedures, and orders are all accurate and complete.   IF YOU HAVE BEEN REFERRED TO A SPECIALIST, IT MAY TAKE 1-2 WEEKS TO SCHEDULE/PROCESS THE REFERRAL. IF YOU HAVE NOT HEARD FROM US/SPECIALIST IN TWO WEEKS, PLEASE GIVE [place] A CALL AT [phone].   THE PATIENT IS ENCOURAGED TO PRACTICE SOCIAL DISTANCING DUE TO THE COVID-19 PANDEMIC.

## 2023-01-17 NOTE — Patient Instructions (Signed)
Cough, Adult A cough helps to clear your throat and lungs. It may be a sign of an illness or another condition. A short-term (acute) cough may last 2-3 weeks. A long-term (chronic) cough may last 8 or more weeks. Many things can cause a cough. They include: Illnesses such as: An infection in your throat or lungs. Asthma or other heart or lung problems. Gastroesophageal reflux. This is when acid comes back up from your stomach. Breathing in things that bother (irritate) your lungs. Allergies. Postnasal drip. This is when mucus runs down the back of your throat. Smoking. Some medicines. Follow these instructions at home: Medicines Take over-the-counter and prescription medicines only as told by your doctor. Talk with your doctor before you take cough medicine (cough suppressants). Eating and drinking Do not drink alcohol. Do not drink caffeine. Drink enough fluid to keep your pee (urine) pale yellow. Lifestyle Stay away from cigarette smoke. Do not smoke or use any products that contain nicotine or tobacco. If you need help quitting, ask your doctor. Stay away from things that make you cough. These may include perfume, candles, cleaning products, or campfire smoke. General instructions  Watch for any changes to your cough. Tell your doctor about them. Always cover your mouth when you cough. If the air is dry in your home, use a cool mist vaporizer or humidifier. If your cough is worse at night, try using extra pillows to raise your head up higher while you sleep. Rest as needed. Contact a doctor if: You have new symptoms. Your symptoms get worse. You cough up pus. You have a fever that does not go away. Your cough does not get better after 2-3 weeks. Cough medicine does not help, and you are not sleeping well. You have pain that gets worse or is not helped with medicine. You are losing weight and do not know why. You have night sweats. Get help right away if: You cough up  blood. You have trouble breathing. Your heart is beating very fast. These symptoms may be an emergency. Get help right away. Call 911. Do not wait to see if the symptoms will go away. Do not drive yourself to the hospital. This information is not intended to replace advice given to you by your health care provider. Make sure you discuss any questions you have with your health care provider. Document Revised: 02/24/2022 Document Reviewed: 02/24/2022 Elsevier Patient Education  2024 [employer]  

## 2023-01-22 ENCOUNTER — Encounter: Payer: Self-pay | Admitting: Internal Medicine

## 2023-01-28 ENCOUNTER — Encounter: Payer: Self-pay | Admitting: Internal Medicine

## 2023-01-29 DIAGNOSIS — J069 Acute upper respiratory infection, unspecified: Secondary | ICD-10-CM | POA: Insufficient documentation

## 2023-01-29 NOTE — Assessment & Plan Note (Signed)
I will send rx Zpak given the longevity of her symptoms. She is encouraged to take the full course and advised to avoid dairy products. Also advised to drink at least one hot beverage daily. May also benefit from OTC antihistamine therapy if cough persists. Will send diflucan, she states she gets yeast infections w/ all abx.

## 2023-02-13 ENCOUNTER — Ambulatory Visit: Admission: RE | Admit: 2023-02-13 | Payer: 59 | Source: Ambulatory Visit

## 2023-02-13 ENCOUNTER — Encounter: Payer: Self-pay | Admitting: Internal Medicine

## 2023-02-13 ENCOUNTER — Ambulatory Visit: Payer: 59 | Admitting: Internal Medicine

## 2023-02-13 VITALS — BP 118/82 | HR 57 | Temp 98.1°F | Ht 69.0 in | Wt 185.4 lb

## 2023-02-13 DIAGNOSIS — R059 Cough, unspecified: Secondary | ICD-10-CM

## 2023-02-13 DIAGNOSIS — R053 Chronic cough: Secondary | ICD-10-CM

## 2023-02-13 NOTE — Patient Instructions (Signed)
Cough, Adult A cough helps to clear your throat and lungs. It may be a sign of an illness or another condition. A short-term (acute) cough may last 2-3 weeks. A long-term (chronic) cough may last 8 or more weeks. Many things can cause a cough. They include: Illnesses such as: An infection in your throat or lungs. Asthma or other heart or lung problems. Gastroesophageal reflux. This is when acid comes back up from your stomach. Breathing in things that bother (irritate) your lungs. Allergies. Postnasal drip. This is when mucus runs down the back of your throat. Smoking. Some medicines. Follow these instructions at home: Medicines Take over-the-counter and prescription medicines only as told by your doctor. Talk with your doctor before you take cough medicine (cough suppressants). Eating and drinking Do not drink alcohol. Do not drink caffeine. Drink enough fluid to keep your pee (urine) pale yellow. Lifestyle Stay away from cigarette smoke. Do not smoke or use any products that contain nicotine or tobacco. If you need help quitting, ask your doctor. Stay away from things that make you cough. These may include perfume, candles, cleaning products, or campfire smoke. General instructions  Watch for any changes to your cough. Tell your doctor about them. Always cover your mouth when you cough. If the air is dry in your home, use a cool mist vaporizer or humidifier. If your cough is worse at night, try using extra pillows to raise your head up higher while you sleep. Rest as needed. Contact a doctor if: You have new symptoms. Your symptoms get worse. You cough up pus. You have a fever that does not go away. Your cough does not get better after 2-3 weeks. Cough medicine does not help, and you are not sleeping well. You have pain that gets worse or is not helped with medicine. You are losing weight and do not know why. You have night sweats. Get help right away if: You cough up  blood. You have trouble breathing. Your heart is beating very fast. These symptoms may be an emergency. Get help right away. Call 911. Do not wait to see if the symptoms will go away. Do not drive yourself to the hospital. This information is not intended to replace advice given to you by your health care provider. Make sure you discuss any questions you have with your health care provider. Document Revised: 02/24/2022 Document Reviewed: 02/24/2022 Elsevier Patient Education  2024 [employer]  

## 2023-02-13 NOTE — Progress Notes (Signed)
I,[name], CMA,acting as a Neurosurgeon for Kimberly Aliment, MD.,have documented all relevant documentation on the behalf of Kimberly Aliment, MD,as directed by  Kimberly Aliment, MD while in the presence of Kimberly Aliment, MD.  Subjective:  Patient ID: Kimberly Maldonado , female    DOB: 04/25/1976 , 47 y.o.   MRN: [id number]  Chief Complaint  Patient presents with   Cough    HPI  Patient presents today with for follow up of cough with phlegm production. She states her sx have improved; however, she has noticed she is more sob during exercise. She is normally able to complete bootcamp without any difficulty, states she is now more fatigued.  There is no associated fever/chills/rhinorrhea/sinus congestion. Sx are accompanied by chest tightness.      Past Medical History:  Diagnosis Date   Allergy    takes Zyrtec and Singulair daily   Anxiety    Breast cancer (HCC) 11/15/2016   right breast CHEMO AND RAIATION DONE   Carpal tunnel syndrome    COVID 08/09/2020   asymptomatic   Depression    takes Cymbalta daily   Genetic testing 12/13/2016   Ms. Clopton underwent genetic counseling and testing for hereditary cancer syndromes on 11/23/2016.Testing was performed through a custom panel that combined [employer]'s Common Hereditary Cancers Panel with [facility]'s Thyroid Cancer Panel. This custom panel includes analysis of the following 48 genes: APC, ATM, AXIN2, BARD1, BMPR1A, BRCA1, BRCA2, BRIP1, CDH1, CDKN2A, CHEK2, CTNNA1, DICER1, EPCAM, GREM1   Insomnia    takes Trazodone and Ambien nightly   Kidney donor    Lymphedema of right arm    HX OF      Family History  Problem Relation Age of Onset   COPD Maternal Grandmother    Hypertension Maternal Grandfather    Prostate cancer Maternal Grandfather 61   Bone cancer Maternal Grandfather 47   Diabetes Mother    Breast cancer Maternal Aunt 18   Thyroid cancer Other 40       d.40s     Current Outpatient Medications:    acetaminophen  (TYLENOL) 500 MG tablet, take 2 tablets po every 6 hours for 5 days then prn- post operative pain (first dose 6:30 pm today), Disp: 30 tablet, Rfl: 1   albuterol (VENTOLIN HFA) 108 (90 Base) MCG/ACT inhaler, Inhale 2 puffs into the lungs every 6 (six) hours as needed for wheezing or shortness of breath., Disp: 18 g, Rfl: 2   CALCIUM PO, Take 1 tablet by mouth daily at 12 noon., Disp: , Rfl:    cetirizine (ZYRTEC) 10 MG tablet, Take 10 mg by mouth daily., Disp: , Rfl:    Cholecalciferol (VITAMIN D3) 125 MCG (5000 UT) TABS, Take 5,000 Units by mouth daily., Disp: , Rfl:    clobetasol ointment (TEMOVATE) 0.05 %, Apply 1 application topically every other day. Applied to scalp for alopecia, Disp: , Rfl:    exemestane (AROMASIN) 25 MG tablet, Take 25 mg by mouth daily., Disp: , Rfl:    levocetirizine (XYZAL) 5 MG tablet, Take 5 mg by mouth every other day as needed for allergies., Disp: , Rfl:    pseudoephedrine-guaifenesin (MUCINEX D) 60-600 MG 12 hr tablet, Take 1 tablet by mouth 2 (two) times daily as needed for congestion., Disp: , Rfl:    traZODone (DESYREL) 50 MG tablet, TAKE 1 TABLET BY MOUTH EVERYDAY AT BEDTIME, Disp: 90 tablet, Rfl: 0   tretinoin (RETIN-A) 0.1 % cream, Apply 1 application topically daily as  needed (acne/skin blemishes.)., Disp: , Rfl:    triamcinolone (KENALOG) 0.1 %, Apply 1 application topically 2 (two) times daily as needed (skin irritation.)., Disp: , Rfl:    Triamcinolone Acetonide (KENALOG IJ), Inject 7.5 mg as directed every 8 (eight) weeks. For alopecia, Disp: , Rfl:    valACYclovir (VALTREX) 500 MG tablet, TAKE 1 TABLET BY ORAL ROUTE EVERY DAY AS NEEDED (Patient taking differently: Take 500 mg by mouth daily as needed (fever blisters.).), Disp: 30 tablet, Rfl: 0   alum & mag hydroxide-simeth (MAALOX MAX) 400-400-40 MG/5ML suspension, Take 5 mLs by mouth every 6 (six) hours as needed for indigestion. (Patient not taking: Reported on 11/29/2022), Disp: 355 mL, Rfl: 0    famotidine (PEPCID) 20 MG tablet, Take 1 tablet (20 mg total) by mouth 2 (two) times daily. (Patient not taking: Reported on 11/29/2022), Disp: 30 tablet, Rfl: 0   fluconazole (DIFLUCAN) 150 MG tablet, One tab po today, repeat in 48 hours (Patient not taking: Reported on 02/13/2023), Disp: 2 tablet, Rfl: 0   ibuprofen (ADVIL) 600 MG tablet, take 2 tablets po pc every 6 hours for 5 days for post operative pain (first dose 10:30 pm. today) (Patient not taking: Reported on 09/05/2022), Disp: 30 tablet, Rfl: 1   venlafaxine XR (EFFEXOR-XR) 37.5 MG 24 hr capsule, TAKE 3 CAPSULES BY MOUTH EVERY DAY IN THE MORNING WITH FOOD (Patient not taking: Reported on 01/17/2023), Disp: 270 capsule, Rfl: 0   No Known Allergies   Review of Systems  Constitutional: Negative.   Respiratory:  Positive for cough and shortness of breath.   Cardiovascular: Negative.   Neurological: Negative.   Psychiatric/Behavioral: Negative.       Today's Vitals   02/13/23 1140  BP: 118/82  Pulse: (!) 57  Temp: 98.1 F (36.7 C)  SpO2: 98%  Weight: 185 lb 6.4 oz (84.1 kg)  Height: 5\' 9"  (1.753 m)   Body mass index is 27.38 kg/m.  Wt Readings from Last 3 Encounters:  02/13/23 185 lb 6.4 oz (84.1 kg)  01/17/23 187 lb 3.2 oz (84.9 kg)  11/29/22 185 lb (83.9 kg)     Objective:  Physical Exam Vitals and nursing note reviewed.  Constitutional:      Appearance: Normal appearance.  HENT:     Head: Normocephalic and atraumatic.  Eyes:     Extraocular Movements: Extraocular movements intact.  Cardiovascular:     Rate and Rhythm: Normal rate and regular rhythm.     Heart sounds: Normal heart sounds.  Pulmonary:     Effort: Pulmonary effort is normal.     Breath sounds: Normal breath sounds.  Musculoskeletal:     Cervical back: Normal range of motion.  Skin:    General: Skin is warm.  Neurological:     General: No focal deficit present.     Mental Status: She is alert.  Psychiatric:        Mood and Affect: Mood normal.         Behavior: Behavior normal.         Assessment And Plan:  Persistent cough for 3 weeks or longer Assessment & Plan: Will check CXR - if negative, will consider albuterol prior to exercise. If sx improve, will consider full Pulmonary evaluation. She agrees to current tx plan. She will again let me know if her sx persist despite use of albuterol.   Orders: -     DG Chest 2 View; Future     Return if symptoms worsen or  fail to improve.  Patient was given opportunity to ask questions. Patient verbalized understanding of the plan and was able to repeat key elements of the plan. All questions were answered to their satisfaction.   I, Kimberly Aliment, MD, have reviewed all documentation for this visit. The documentation on 02/13/23 for the exam, diagnosis, procedures, and orders are all accurate and complete.   IF YOU HAVE BEEN REFERRED TO A SPECIALIST, IT MAY TAKE 1-2 WEEKS TO SCHEDULE/PROCESS THE REFERRAL. IF YOU HAVE NOT HEARD FROM US/SPECIALIST IN TWO WEEKS, PLEASE GIVE [place] A CALL AT [phone].   THE PATIENT IS ENCOURAGED TO PRACTICE SOCIAL DISTANCING DUE TO THE COVID-19 PANDEMIC.

## 2023-02-20 DIAGNOSIS — R053 Chronic cough: Secondary | ICD-10-CM | POA: Insufficient documentation

## 2023-02-20 NOTE — Assessment & Plan Note (Signed)
Will check CXR - if negative, will consider albuterol prior to exercise. If sx improve, will consider full Pulmonary evaluation. She agrees to current tx plan. She will again let me know if her sx persist despite use of albuterol.

## 2023-05-25 ENCOUNTER — Other Ambulatory Visit: Payer: Self-pay | Admitting: Internal Medicine

## 2023-05-25 DIAGNOSIS — F5105 Insomnia due to other mental disorder: Secondary | ICD-10-CM

## 2023-06-26 ENCOUNTER — Other Ambulatory Visit: Payer: Self-pay | Admitting: Internal Medicine

## 2023-06-26 DIAGNOSIS — F5105 Insomnia due to other mental disorder: Secondary | ICD-10-CM

## 2023-07-09 ENCOUNTER — Encounter: Payer: Self-pay | Admitting: Internal Medicine

## 2023-07-10 ENCOUNTER — Other Ambulatory Visit: Payer: Self-pay | Admitting: Nurse Practitioner

## 2023-07-10 DIAGNOSIS — F5105 Insomnia due to other mental disorder: Secondary | ICD-10-CM

## 2023-07-10 MED ORDER — TRAZODONE HCL 50 MG PO TABS: ORAL_TABLET | ORAL | 0 refills | Status: DC

## 2023-10-06 ENCOUNTER — Other Ambulatory Visit: Payer: Self-pay | Admitting: Nurse Practitioner

## 2023-10-06 DIAGNOSIS — F5105 Insomnia due to other mental disorder: Secondary | ICD-10-CM

## 2023-10-09 ENCOUNTER — Ambulatory Visit: Payer: Self-pay

## 2023-10-09 NOTE — Telephone Encounter (Signed)
 Chief Complaint: Lump in pelvic/vaginal area Symptoms: Lump Frequency: noticed yesterday Pertinent Negatives: Patient denies pain, itching, tingling Disposition: [] ED /[] Urgent Care (no appt availability in office) / [x] Appointment(In office/virtual)/ []  [facility]/ [] Home Care/ [] Refused Recommended Disposition /[]  Mobile Bus/ []  Follow-up with PCP Additional Notes: Patient called to report that last night she noticed a lump in the vaginal area on her pelvic bone. Patient states it is not attached to the bone because it is moveable. Patient denies pain to the touch and denies itchiness. Patient states the lump is comparable the size of an egg cut in half. Patient is a cancer survivor and concerned when any lumps appear. Patient also would like to discuss with PCP her fear of flying and potential PRN medication to assist with flying for her job.    Copied from CRM [id number]. Topic: Clinical - Red Word Triage >> Oct 09, 2023  3:10 PM Shardie S wrote: [facility] that prompted transfer to Nurse Triage: Anxiety, lump on pelvic bone Reason for Disposition  [1] Small swelling or lump AND [2] unexplained AND [3] present > 1 week    Lump she just noticed  Answer Assessment - Initial Assessment Questions 1. APPEARANCE of SWELLING: "What does it look like?"     No redness or discoloration, no drainage 2. SIZE: "How large is the swelling?" (e.g., inches, cm; or compare to size of pinhead, tip of pen, eraser, coin, pea, grape, ping pong ball)      Oval shape - half of an egg 3. LOCATION: "Where is the swelling located?"     In vaginal area over bone 4. ONSET: "When did the swelling start?"     First noticed last night 5. COLOR: "What color is it?" "Is there more than one color?"     Skin colored 6. PAIN: "Is there any pain?" If Yes, ask: "How bad is the pain?" (e.g., scale 1-10; or mild, moderate, severe)     - NONE (0): no pain   - MILD (1-3): doesn't interfere with normal  activities    - MODERATE (4-7): interferes with normal activities or awakens from sleep    - SEVERE (8-10): excruciating pain, unable to do any normal activities     No 7. ITCH: "Does it itch?" If Yes, ask: "How bad is the itch?"      No 8. CAUSE: "What do you think caused the swelling?"   Unknown 9 OTHER SYMPTOMS: "Do you have any other symptoms?" (e.g., fever)     No  Protocols used: Skin Lump or Localized Swelling-A-AH

## 2023-10-10 ENCOUNTER — Ambulatory Visit (INDEPENDENT_AMBULATORY_CARE_PROVIDER_SITE_OTHER): Admitting: Internal Medicine

## 2023-10-10 ENCOUNTER — Encounter: Payer: Self-pay | Admitting: Internal Medicine

## 2023-10-10 VITALS — BP 108/78 | HR 66 | Temp 98.4°F | Ht 69.0 in | Wt 191.2 lb

## 2023-10-10 DIAGNOSIS — F5104 Psychophysiologic insomnia: Secondary | ICD-10-CM

## 2023-10-10 DIAGNOSIS — F5105 Insomnia due to other mental disorder: Secondary | ICD-10-CM

## 2023-10-10 DIAGNOSIS — M7989 Other specified soft tissue disorders: Secondary | ICD-10-CM

## 2023-10-10 DIAGNOSIS — F40243 Fear of flying: Secondary | ICD-10-CM

## 2023-10-10 MED ORDER — TRAZODONE HCL 50 MG PO TABS: ORAL_TABLET | ORAL | 1 refills | Status: DC

## 2023-10-10 MED ORDER — DIAZEPAM 5 MG PO TABS: ORAL_TABLET | ORAL | 0 refills | Status: DC

## 2023-10-10 NOTE — Progress Notes (Signed)
 I,Kimberly Maldonado, CMA,acting as a Neurosurgeon for Kimberly Aliment, MD.,have documented all relevant documentation on the behalf of Kimberly Aliment, MD,as directed by  Kimberly Aliment, MD while in the presence of Kimberly Aliment, MD.  Subjective:  Patient ID: Kimberly Maldonado , female    DOB: [birth year] , 48 y.o.   MRN: [id number]  Chief Complaint  Patient presents with   Mass    Patient presents today for mass. She reports noticing this on her pelvic area 2 nights ago. It is moveable. No pain or drainage. First time this lump has appeared. She is currently established with Derm.     HPI Discussed the use of AI scribe software for clinical note transcription with the patient, who gave verbal consent to proceed.  History of Present Illness [name] is a 48 year old female who presents with a lump on her pelvic bone and flying anxiety.  She noticed a lump on her pelvic bone two nights ago after feeling like she pulled something at the gym. The lump is palpable under her fingers, more prominent when standing, and less so when lying down. No pain is associated with the lump.  She experiences long-standing anxiety related to flying, which has worsened over time. Symptoms include heart palpitations and anxiety, despite rationalizing her fears. She has previously used Zolpidem (Ambien) for flights when accompanied by friends but is unable to use it when flying alone. She has joined Occupational hygienist groups on social media to manage her anxiety, but this has not been effective. She travels frequently for work, often flying alone, and has an upcoming trip to [place] in June and another to [place] in August. She has tried a gummy in the past, which helped her relax, but she does not have regular access to it.  She has previously been on Effexor, which she has tapered off, and is currently taking Trazodone, especially as her daughter is leaving for college. 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  The current political climate is affecting her livelihood.    Past Medical History:  Diagnosis Date   Allergy    takes Zyrtec and Singulair daily   Anxiety    Breast cancer (HCC) 11/15/2016   right breast CHEMO AND RAIATION DONE   Carpal tunnel syndrome    COVID 08/09/2020   asymptomatic   Depression    takes Cymbalta daily   Genetic testing 12/13/2016   Ms. Andreoni underwent genetic counseling and testing for hereditary cancer syndromes on 11/23/2016.Testing was performed through a custom panel that combined [employer]'s Common Hereditary Cancers Panel with [facility]'s Thyroid Cancer Panel. This custom panel includes analysis of the following 48 genes: APC, ATM, AXIN2, BARD1, BMPR1A, BRCA1, BRCA2, BRIP1, CDH1, CDKN2A, CHEK2, CTNNA1, DICER1, EPCAM, GREM1   Insomnia    takes Trazodone and Ambien nightly   Kidney donor    Lymphedema of right arm    HX OF      Family History  Problem Relation Age of Onset   COPD Maternal Grandmother    Hypertension Maternal Grandfather    Prostate cancer Maternal Grandfather 85   Bone cancer Maternal Grandfather 14   Diabetes Mother    Breast cancer Maternal Aunt 55   Thyroid cancer Other 40       d.40s     Current Outpatient Medications:  acetaminophen (TYLENOL) 500 MG tablet, take 2 tablets po every 6 hours for 5 days then prn- post operative pain (first dose 6:30 pm today), Disp: 30 tablet, Rfl: 1   albuterol (VENTOLIN HFA) 108 (90 Base) MCG/ACT inhaler, Inhale 2 puffs into the lungs every 6 (six) hours as needed for wheezing or shortness of breath., Disp: 18 g, Rfl: 2   CALCIUM PO, Take 1 tablet by mouth daily at 12 noon., Disp: , Rfl:    cetirizine (ZYRTEC) 10 MG tablet, Take 10 mg by mouth daily., Disp: , Rfl:    Cholecalciferol (VITAMIN D3) 125 MCG (5000 UT) TABS, Take 5,000 Units by mouth  daily., Disp: , Rfl:    clobetasol ointment (TEMOVATE) 0.05 %, Apply 1 application topically every other day. Applied to scalp for alopecia, Disp: , Rfl:    diazepam (VALIUM) 5 MG tablet, Take one tablet one hour before travel, repeat as needed, Disp: 10 tablet, Rfl: 0   exemestane (AROMASIN) 25 MG tablet, Take 25 mg by mouth daily., Disp: , Rfl:    levocetirizine (XYZAL) 5 MG tablet, Take 5 mg by mouth every other day as needed for allergies., Disp: , Rfl:    pseudoephedrine-guaifenesin (MUCINEX D) 60-600 MG 12 hr tablet, Take 1 tablet by mouth 2 (two) times daily as needed for congestion., Disp: , Rfl:    tretinoin (RETIN-A) 0.1 % cream, Apply 1 application topically daily as needed (acne/skin blemishes.)., Disp: , Rfl:    triamcinolone (KENALOG) 0.1 %, Apply 1 application topically 2 (two) times daily as needed (skin irritation.)., Disp: , Rfl:    Triamcinolone Acetonide (KENALOG IJ), Inject 7.5 mg as directed every 8 (eight) weeks. For alopecia, Disp: , Rfl:    valACYclovir (VALTREX) 500 MG tablet, TAKE 1 TABLET BY ORAL ROUTE EVERY DAY AS NEEDED (Patient taking differently: Take 500 mg by mouth daily as needed (fever blisters.).), Disp: 30 tablet, Rfl: 0   alum & mag hydroxide-simeth (MAALOX MAX) 400-400-40 MG/5ML suspension, Take 5 mLs by mouth every 6 (six) hours as needed for indigestion. (Patient not taking: Reported on 11/29/2022), Disp: 355 mL, Rfl: 0   famotidine (PEPCID) 20 MG tablet, Take 1 tablet (20 mg total) by mouth 2 (two) times daily. (Patient not taking: Reported on 11/29/2022), Disp: 30 tablet, Rfl: 0   fluconazole (DIFLUCAN) 150 MG tablet, One tab po today, repeat in 48 hours (Patient not taking: Reported on 10/10/2023), Disp: 2 tablet, Rfl: 0   ibuprofen (ADVIL) 600 MG tablet, take 2 tablets po pc every 6 hours for 5 days for post operative pain (first dose 10:30 pm. today) (Patient not taking: Reported on 09/05/2022), Disp: 30 tablet, Rfl: 1   traZODone (DESYREL) 50 MG tablet, TAKE 1  TABLET BY MOUTH EVERYDAY AT BEDTIME, Disp: 90 tablet, Rfl: 1   No Known Allergies   Review of Systems  Constitutional: Negative.   Respiratory: Negative.    Cardiovascular: Negative.   Gastrointestinal: Negative.   Neurological: Negative.   Psychiatric/Behavioral: Negative.       Today's Vitals   10/10/23 1617  BP: 108/78  Pulse: 66  Temp: 98.4 F (36.9 C)  SpO2: 98%  Weight: 191 lb 3.2 oz (86.7 kg)  Height: 5\' 9"  (1.753 m)   Body mass index is 28.24 kg/m.  Wt Readings from Last 3 Encounters:  10/10/23 191 lb 3.2 oz (86.7 kg)  02/13/23 185 lb 6.4 oz (84.1 kg)  01/17/23 187 lb 3.2 oz (84.9 kg)     Objective:  Physical Exam  Vitals and nursing note reviewed.  Constitutional:      Appearance: Normal appearance.  HENT:     Head: Normocephalic and atraumatic.  Eyes:     Extraocular Movements: Extraocular movements intact.  Cardiovascular:     Rate and Rhythm: Normal rate and regular rhythm.     Heart sounds: Normal heart sounds.  Pulmonary:     Effort: Pulmonary effort is normal.     Breath sounds: Normal breath sounds.  Musculoskeletal:     Cervical back: Normal range of motion.  Skin:    General: Skin is warm.     Comments: Soft tissue mass, right hip, lateral to groin  Neurological:     General: No focal deficit present.     Mental Status: She is alert.  Psychiatric:        Mood and Affect: Mood normal.        Behavior: Behavior normal.         Assessment And Plan:  Soft tissue mass Assessment & Plan: Noticed a lump in the right inguinal area, suspected lipoma. Requires ultrasound for confirmation. - Ordered ultrasound of the right inguinal area.  Orders: -     US SOFT TISSUE LOWER EXTREMITY LIMITED RIGHT (NON-VASCULAR); Future  Anxiety with flying Assessment & Plan: Experiences significant anxiety related to flying. Previously used Ambien, now considering Valium for management. - Prescribed Valium, take one tablet one hour before travel, repeat  as needed. - Advised trial of Valium at home before travel to assess response. - Plan to test Valium during travel to [place] in June.   Chronic insomnia Assessment & Plan: Chronic, she feels her sx have decreased. Advised to continue with trazodone nightly until further notice. She agrees to c/w treatment plan.   Orders: -     traZODone HCl; TAKE 1 TABLET BY MOUTH EVERYDAY AT BEDTIME  Dispense: 90 tablet; Refill: 1  Other orders -     diazePAM; Take one tablet one hour before travel, repeat as needed  Dispense: 10 tablet; Refill: 0   Return if symptoms worsen or fail to improve.  Patient was given opportunity to ask questions. Patient verbalized understanding of the plan and was able to repeat key elements of the plan. All questions were answered to their satisfaction.    I, Kimberly Aliment, MD, have reviewed all documentation for this visit. The documentation on 10/10/23 for the exam, diagnosis, procedures, and orders are all accurate and complete.   IF YOU HAVE BEEN REFERRED TO A SPECIALIST, IT MAY TAKE 1-2 WEEKS TO SCHEDULE/PROCESS THE REFERRAL. IF YOU HAVE NOT HEARD FROM US/SPECIALIST IN TWO WEEKS, PLEASE GIVE [place] A CALL AT [phone].   THE PATIENT IS ENCOURAGED TO PRACTICE SOCIAL DISTANCING DUE TO THE COVID-19 PANDEMIC.

## 2023-10-13 ENCOUNTER — Encounter: Payer: Self-pay | Admitting: Internal Medicine

## 2023-10-14 DIAGNOSIS — M7989 Other specified soft tissue disorders: Secondary | ICD-10-CM | POA: Insufficient documentation

## 2023-10-14 DIAGNOSIS — F40243 Fear of flying: Secondary | ICD-10-CM | POA: Insufficient documentation

## 2023-10-14 NOTE — Assessment & Plan Note (Signed)
 Chronic, she feels her sx have decreased. Advised to continue with trazodone nightly until further notice. She agrees to c/w treatment plan.

## 2023-10-14 NOTE — Assessment & Plan Note (Addendum)
 Noticed a lump in the right inguinal area, suspected lipoma. Requires ultrasound for confirmation. - Ordered ultrasound of the right inguinal area.

## 2023-10-14 NOTE — Assessment & Plan Note (Addendum)
 Experiences significant anxiety related to flying. Previously used Ambien, now considering Valium for management. - Prescribed Valium, take one tablet one hour before travel, repeat as needed. - Advised trial of Valium at home before travel to assess response. - Plan to test Valium during travel to [place] in June.

## 2023-10-16 ENCOUNTER — Ambulatory Visit (HOSPITAL_BASED_OUTPATIENT_CLINIC_OR_DEPARTMENT_OTHER)
Admission: RE | Admit: 2023-10-16 | Discharge: 2023-10-16 | Disposition: A | Discharge status: Home / Self Care | Source: Ambulatory Visit | Attending: Internal Medicine | Admitting: Internal Medicine

## 2023-10-16 DIAGNOSIS — M7989 Other specified soft tissue disorders: Secondary | ICD-10-CM | POA: Insufficient documentation

## 2023-10-30 ENCOUNTER — Encounter: Payer: Self-pay | Admitting: Internal Medicine

## 2023-12-12 ENCOUNTER — Encounter: Payer: Self-pay | Admitting: Internal Medicine

## 2023-12-12 ENCOUNTER — Ambulatory Visit (INDEPENDENT_AMBULATORY_CARE_PROVIDER_SITE_OTHER): Payer: 59 | Admitting: Internal Medicine

## 2023-12-12 VITALS — BP 118/62 | HR 83 | Temp 98.7°F | Ht 69.0 in | Wt 189.8 lb

## 2023-12-12 DIAGNOSIS — Z0000 Encounter for general adult medical examination without abnormal findings: Secondary | ICD-10-CM | POA: Diagnosis not present

## 2023-12-12 DIAGNOSIS — F324 Major depressive disorder, single episode, in partial remission: Secondary | ICD-10-CM

## 2023-12-12 DIAGNOSIS — R1909 Other intra-abdominal and pelvic swelling, mass and lump: Secondary | ICD-10-CM | POA: Diagnosis not present

## 2023-12-12 DIAGNOSIS — E559 Vitamin D deficiency, unspecified: Secondary | ICD-10-CM

## 2023-12-12 DIAGNOSIS — F5104 Psychophysiologic insomnia: Secondary | ICD-10-CM

## 2023-12-12 NOTE — Patient Instructions (Addendum)
 Oscillococcinum  Health Maintenance, Female Adopting a healthy lifestyle and getting preventive care are important in promoting health and wellness. Ask your health care provider about: The right schedule for you to have regular tests and exams. Things you can do on your own to prevent diseases and keep yourself healthy. What should I know about diet, weight, and exercise? Eat a healthy diet  Eat a diet that includes plenty of vegetables, fruits, low-fat dairy products, and lean protein. Do not eat a lot of foods that are high in solid fats, added sugars, or sodium. Maintain a healthy weight Body mass index (BMI) is used to identify weight problems. It estimates body fat based on height and weight. Your health care provider can help determine your BMI and help you achieve or maintain a healthy weight. Get regular exercise Get regular exercise. This is one of the most important things you can do for your health. Most adults should: Exercise for at least 150 minutes each week. The exercise should increase your heart rate and make you sweat (moderate-intensity exercise). Do strengthening exercises at least twice a week. This is in addition to the moderate-intensity exercise. Spend less time sitting. Even light physical activity can be beneficial. Watch cholesterol and blood lipids Have your blood tested for lipids and cholesterol at 48 years of age, then have this test every 5 years. Have your cholesterol levels checked more often if: Your lipid or cholesterol levels are high. You are older than 48 years of age. You are at high risk for heart disease. What should I know about cancer screening? Depending on your health history and family history, you may need to have cancer screening at various ages. This may include screening for: Breast cancer. Cervical cancer. Colorectal cancer. Skin cancer. Lung cancer. What should I know about heart disease, diabetes, and high blood pressure? Blood  pressure and heart disease High blood pressure causes heart disease and increases the risk of stroke. This is more likely to develop in people who have high blood pressure readings or are overweight. Have your blood pressure checked: Every 3-5 years if you are 70-72 years of age. Every year if you are 76 years old or older. Diabetes Have regular diabetes screenings. This checks your fasting blood sugar level. Have the screening done: Once every three years after age 38 if you are at a normal weight and have a low risk for diabetes. More often and at a younger age if you are overweight or have a high risk for diabetes. What should I know about preventing infection? Hepatitis B If you have a higher risk for hepatitis B, you should be screened for this virus. Talk with your health care provider to find out if you are at risk for hepatitis B infection. Hepatitis C Testing is recommended for: Everyone born from [birth year] through 1965. Anyone with known risk factors for hepatitis C. Sexually transmitted infections (STIs) Get screened for STIs, including gonorrhea and chlamydia, if: You are sexually active and are younger than 48 years of age. You are older than 48 years of age and your health care provider tells you that you are at risk for this type of infection. Your sexual activity has changed since you were last screened, and you are at increased risk for chlamydia or gonorrhea. Ask your health care provider if you are at risk. Ask your health care provider about whether you are at high risk for HIV. Your health care provider may recommend a prescription medicine to help  prevent HIV infection. If you choose to take medicine to prevent HIV, you should first get tested for HIV. You should then be tested every 3 months for as long as you are taking the medicine. Pregnancy If you are about to stop having your period (premenopausal) and you may become pregnant, seek counseling before you get  pregnant. Take 400 to 800 micrograms (mcg) of folic acid  every day if you become pregnant. Ask for birth control (contraception) if you want to prevent pregnancy. Osteoporosis and menopause Osteoporosis is a disease in which the bones lose minerals and strength with aging. This can result in bone fractures. If you are 16 years old or older, or if you are at risk for osteoporosis and fractures, ask your health care provider if you should: Be screened for bone loss. Take a calcium or vitamin D supplement to lower your risk of fractures. Be given hormone replacement therapy (HRT) to treat symptoms of menopause. Follow these instructions at home: Alcohol use Do not drink alcohol if: Your health care provider tells you not to drink. You are pregnant, may be pregnant, or are planning to become pregnant. If you drink alcohol: Limit how much you have to: 0-1 drink a day. Know how much alcohol is in your drink. In the [place] one drink equals one 12 oz bottle of beer (355 mL), one 5 oz glass of wine (148 mL), or one 1 oz glass of hard liquor (44 mL). Lifestyle Do not use any products that contain nicotine or tobacco. These products include cigarettes, chewing tobacco, and vaping devices, such as e-cigarettes. If you need help quitting, ask your health care provider. Do not use street drugs. Do not share needles. Ask your health care provider for help if you need support or information about quitting drugs. General instructions Schedule regular health, dental, and eye exams. Stay current with your vaccines. Tell your health care provider if: You often feel depressed. You have ever been abused or do not feel safe at home. Summary Adopting a healthy lifestyle and getting preventive care are important in promoting health and wellness. Follow your health care provider's instructions about healthy diet, exercising, and getting tested or screened for diseases. Follow your health care provider's  instructions on monitoring your cholesterol and blood pressure. This information is not intended to replace advice given to you by your health care provider. Make sure you discuss any questions you have with your health care provider. Document Revised: 11/15/2020 Document Reviewed: 11/15/2020 Elsevier Patient Education  2024 [employer].

## 2023-12-12 NOTE — Progress Notes (Signed)
 I,[name], CMA,acting as a Neurosurgeon for Smiley Dung, MD.,have documented all relevant documentation on the behalf of Smiley Dung, MD,as directed by  Smiley Dung, MD while in the presence of Smiley Dung, MD.  Subjective:    Patient ID: Kimberly Maldonado , female    DOB: Mar 16, 1976 , 48 y.o.   MRN: [id number]  Chief Complaint  Patient presents with   Annual Exam    Patient presents today for annual exam. Patient has a few questions about some vitamins. Denies headaches, chest pain & sob     HPI Discussed the use of AI scribe software for clinical note transcription with the patient, who gave verbal consent to proceed.  History of Present Illness Kimberly Maldonado is a 48 year old female who presents for an annual physical exam and follow-up on a pelvic lump.  She has a lump on her right pelvic bone that has gradually increased in size, described as 'growing out' rather than across. It is not painful but causes occasional pinching when sitting up, which she finds annoying. An ultrasound showed no lymphadenopathy, and she is scheduled for a PET scan. She has been in contact with her oncologist regarding this issue.  She denies any vaginal discharge and has been sexually inactive for eight years. She had a hysterectomy and does not have a uterus or ovaries. She has not experienced any weight loss and reports regular bowel movements every other day. She acknowledges not drinking enough water.  Her past medical history includes a slightly torn meniscus, a Baker's cyst, and arthritis in her knee. She recalls the Baker's cyst bursting, which provided immediate relief from pain. She has been evaluated by Ortho at [facility].  She has a history of using Retin A cream for acne and a scalp treatment for alopecia.  She takes Zyrtec daily, cutting the dose in half, and uses Mucinex as needed for sinus issues. She also takes ibuprofen  as needed and has been prescribed trazodone  for sleep, which she  uses regularly. She has not used the albuterol  or Maalox previously prescribed and requests their removal from her medication list.  She is gainfully employed and exercises regularly, though she experiences knee discomfort during activities like squats. She reports good sleep quality with the use of trazodone .   Past Medical History:  Diagnosis Date   Allergy    takes Zyrtec and Singulair daily   Anxiety    Breast cancer (HCC) 11/15/2016   right breast CHEMO AND RAIATION DONE   Carpal tunnel syndrome    COVID 08/09/2020   asymptomatic   Depression    takes Cymbalta daily   Genetic testing 12/13/2016   Ms. Bors underwent genetic counseling and testing for hereditary cancer syndromes on 11/23/2016.Testing was performed through a custom panel that combined [employer]'s Common Hereditary Cancers Panel with [employer]'s Thyroid Cancer Panel. This custom panel includes analysis of the following 48 genes: APC, ATM, AXIN2, BARD1, BMPR1A, BRCA1, BRCA2, BRIP1, CDH1, CDKN2A, CHEK2, CTNNA1, DICER1, EPCAM, GREM1   Insomnia    takes Trazodone  and Ambien  nightly   Kidney donor    Lymphedema of right arm    HX OF      Family History  Problem Relation Age of Onset   COPD Maternal Grandmother    Hypertension Maternal Grandfather    Prostate cancer Maternal Grandfather 3   Bone cancer Maternal Grandfather 71   Diabetes Mother    Breast cancer Maternal Aunt 10   Thyroid cancer Other 40  d.40s     Current Outpatient Medications:    acetaminophen  (TYLENOL ) 500 MG tablet, take 2 tablets po every 6 hours for 5 days then prn- post operative pain (first dose 6:30 pm today), Disp: 30 tablet, Rfl: 1   CALCIUM PO, Take 1 tablet by mouth daily at 12 noon., Disp: , Rfl:    cetirizine (ZYRTEC) 10 MG tablet, Take 10 mg by mouth daily., Disp: , Rfl:    Cholecalciferol (VITAMIN D3) 125 MCG (5000 UT) TABS, Take 5,000 Units by mouth daily., Disp: , Rfl:    clobetasol ointment (TEMOVATE) 0.05 %, Apply 1  application topically every other day. Applied to scalp for alopecia, Disp: , Rfl:    exemestane (AROMASIN) 25 MG tablet, Take 25 mg by mouth daily., Disp: , Rfl:    pseudoephedrine-guaifenesin (MUCINEX D) 60-600 MG 12 hr tablet, Take 1 tablet by mouth 2 (two) times daily as needed for congestion., Disp: , Rfl:    traZODone  (DESYREL ) 50 MG tablet, TAKE 1 TABLET BY MOUTH EVERYDAY AT BEDTIME, Disp: 90 tablet, Rfl: 1   tretinoin (RETIN-A) 0.1 % cream, Apply 1 application topically daily as needed (acne/skin blemishes.)., Disp: , Rfl:    triamcinolone (KENALOG) 0.1 %, Apply 1 application topically 2 (two) times daily as needed (skin irritation.)., Disp: , Rfl:    Triamcinolone Acetonide (KENALOG IJ), Inject 7.5 mg as directed every 8 (eight) weeks. For alopecia, Disp: , Rfl:    valACYclovir (VALTREX) 500 MG tablet, TAKE 1 TABLET BY ORAL ROUTE EVERY DAY AS NEEDED (Patient taking differently: Take 500 mg by mouth daily as needed (fever blisters.).), Disp: 30 tablet, Rfl: 0   famotidine  (PEPCID ) 20 MG tablet, Take 1 tablet (20 mg total) by mouth 2 (two) times daily. (Patient not taking: Reported on 11/29/2022), Disp: 30 tablet, Rfl: 0   ibuprofen  (ADVIL ) 600 MG tablet, take 2 tablets po pc every 6 hours for 5 days for post operative pain (first dose 10:30 pm. today) (Patient not taking: Reported on 09/05/2022), Disp: 30 tablet, Rfl: 1   No Known Allergies    The patient states she uses none for birth control. Patient's last menstrual period was 12/08/2016.. Negative for Dysmenorrhea. Negative for: breast discharge, breast lump(s), breast pain and breast self exam. Associated symptoms include abnormal vaginal bleeding. Pertinent negatives include abnormal bleeding (hematology), anxiety, decreased libido, depression, difficulty falling sleep, dyspareunia, history of infertility, nocturia, sexual dysfunction, sleep disturbances, urinary incontinence, urinary urgency, vaginal discharge and vaginal itching. Diet  regular.The patient states her exercise level is    . The patient's tobacco use is:  Social History   Tobacco Use  Smoking Status Former   Current packs/day: 0.00   Average packs/day: 0.3 packs/day for 2.0 years (0.5 ttl pk-yrs)   Types: Cigarettes   Start date: 66   Quit date: 1997   Years since quitting: 28.4  Smokeless Tobacco Never  Tobacco Comments   quit smoking 21 yrs ago  . She has been exposed to passive smoke. The patient's alcohol use is:  Social History   Substance and Sexual Activity  Alcohol Use Yes   Comment: 4 glasses per weekEND    Review of Systems  Constitutional: Negative.   HENT: Negative.    Eyes: Negative.   Respiratory: Negative.    Cardiovascular: Negative.   Gastrointestinal: Negative.   Endocrine: Negative.   Genitourinary: Negative.   Musculoskeletal: Negative.   Skin: Negative.   Allergic/Immunologic: Negative.   Neurological: Negative.   Hematological: Negative.   Psychiatric/Behavioral:  Negative.       Today's Vitals   12/12/23 0839  BP: 118/62  Pulse: 83  Temp: 98.7 F (37.1 C)  SpO2: 98%  Weight: 189 lb 12.8 oz (86.1 kg)  Height: 5\' 9"  (1.753 m)   Body mass index is 28.03 kg/m.  Wt Readings from Last 3 Encounters:  12/12/23 189 lb 12.8 oz (86.1 kg)  10/10/23 191 lb 3.2 oz (86.7 kg)  02/13/23 185 lb 6.4 oz (84.1 kg)     Objective:  Physical Exam Vitals and nursing note reviewed.  Constitutional:      Appearance: Normal appearance.  HENT:     Head: Normocephalic and atraumatic.     Right Ear: Tympanic membrane, ear canal and external ear normal.     Left Ear: Tympanic membrane, ear canal and external ear normal.     Nose: Nose normal.     Mouth/Throat:     Mouth: Mucous membranes are moist.     Pharynx: Oropharynx is clear.  Eyes:     Extraocular Movements: Extraocular movements intact.     Conjunctiva/sclera: Conjunctivae normal.     Pupils: Pupils are equal, round, and reactive to light.  Cardiovascular:      Rate and Rhythm: Normal rate and regular rhythm.     Pulses: Normal pulses.     Heart sounds: Normal heart sounds.  Pulmonary:     Effort: Pulmonary effort is normal.     Breath sounds: Normal breath sounds.  Chest:  Breasts:    Right: Absent.     Left: Absent.     Comments: She is s/p b/l mastectomy Abdominal:     General: Abdomen is flat. Bowel sounds are normal.     Palpations: Abdomen is soft.     Comments:  Right groin swelling  Genitourinary:    Comments: deferred Musculoskeletal:        General: Normal range of motion.     Cervical back: Normal range of motion and neck supple.  Skin:    General: Skin is warm and dry.     Comments: : Well-healed scar on left upper chest wall at prior port site, no overlying erythema  Neurological:     General: No focal deficit present.     Mental Status: She is alert and oriented to person, place, and time.  Psychiatric:        Mood and Affect: Mood normal.        Behavior: Behavior normal.       Assessment And Plan:     Encounter for general adult medical examination w/o abnormal findings Assessment & Plan: A full exam was performed.  Importance of monthly self breast exams was discussed with the patient.  She is advised to get 30-45 minutes of regular exercise, no less than four to five days per week. Both weight-bearing and aerobic exercises are recommended.  She is advised to follow a healthy diet with at least six fruits/veggies per day, decrease intake of red meat and other saturated fats and to increase fish intake to twice weekly.  Meats/fish should not be fried -- baked, boiled or broiled is preferable. It is also important to cut back on your sugar intake.  Be sure to read labels - try to avoid anything with added sugar, high fructose corn syrup or other sweeteners.  If you must use a sweetener, you can try stevia or monkfruit.  It is also important to avoid artificially sweetened foods/beverages and diet drinks. Lastly, wear SPF  50 sunscreen on exposed skin and when in direct sunlight for an extended period of time.  Be sure to avoid fast food restaurants and aim for at least 60 ounces of water daily.      Orders: -     CBC -     CMP14+EGFR -     Lipid panel -     Hemoglobin A1c  Groin swelling Assessment & Plan: A growing lump on the right pelvic bone with minimal lymphadenopathy. Differential includes lipoma, hernia, or benign tissue growth. PET scan scheduled for evaluation per Oncology. - Proceed with PET scan - Consider further evaluation if PET scan results are inconclusive.   Vitamin D  deficiency disease -     VITAMIN D  25 Hydroxy (Vit-D Deficiency, Fractures)   Return in 6 months (on 06/12/2024), or trazodone  f/u, for 1 YEAR HM, . Patient was given opportunity to ask questions. Patient verbalized understanding of the plan and was able to repeat key elements of the plan. All questions were answered to their satisfaction.   I, Smiley Dung, MD, have reviewed all documentation for this visit. The documentation on 12/12/23 for the exam, diagnosis, procedures, and orders are all accurate and complete.

## 2023-12-13 LAB — CMP14+EGFR
ALT: 31 IU/L (ref 0–32)
AST: 24 IU/L (ref 0–40)
Albumin: 4.7 g/dL (ref 3.9–4.9)
Alkaline Phosphatase: 90 IU/L (ref 44–121)
BUN/Creatinine Ratio: 11 (ref 9–23)
BUN: 12 mg/dL (ref 6–24)
Bilirubin Total: 0.5 mg/dL (ref 0.0–1.2)
CO2: 20 mmol/L (ref 20–29)
Calcium: 10 mg/dL (ref 8.7–10.2)
Chloride: 103 mmol/L (ref 96–106)
Creatinine, Ser: 1.06 mg/dL — ABNORMAL HIGH (ref 0.57–1.00)
Globulin, Total: 2.5 g/dL (ref 1.5–4.5)
Glucose: 89 mg/dL (ref 70–99)
Potassium: 4.9 mmol/L (ref 3.5–5.2)
Sodium: 140 mmol/L (ref 134–144)
Total Protein: 7.2 g/dL (ref 6.0–8.5)
eGFR: 65 mL/min/{1.73_m2} (ref >59)

## 2023-12-13 LAB — CBC
Hematocrit: 42.7 % (ref 34.0–46.6)
Hemoglobin: 13.9 g/dL (ref 11.1–15.9)
MCH: 30.5 pg (ref 26.6–33.0)
MCHC: 32.6 g/dL (ref 31.5–35.7)
MCV: 94 fL (ref 79–97)
Platelets: 218 10*3/uL (ref 150–450)
RBC: 4.55 x10E6/uL (ref 3.77–5.28)
RDW: 14.6 % (ref 11.7–15.4)
WBC: 3.4 10*3/uL (ref 3.4–10.8)

## 2023-12-13 LAB — HEMOGLOBIN A1C
Est. average glucose Bld gHb Est-mCnc: 123 mg/dL
Hgb A1c MFr Bld: 5.9 % — ABNORMAL HIGH (ref 4.8–5.6)

## 2023-12-13 LAB — VITAMIN D 25 HYDROXY (VIT D DEFICIENCY, FRACTURES): Vit D, 25-Hydroxy: 42.9 ng/mL (ref 30.0–100.0)

## 2023-12-13 LAB — LIPID PANEL
Chol/HDL Ratio: 2.5 ratio (ref 0.0–4.4)
Cholesterol, Total: 126 mg/dL (ref 100–199)
HDL: 51 mg/dL (ref >39)
LDL Chol Calc (NIH): 59 mg/dL (ref 0–99)
Triglycerides: 78 mg/dL (ref 0–149)
VLDL Cholesterol Cal: 16 mg/dL (ref 5–40)

## 2023-12-17 ENCOUNTER — Ambulatory Visit: Payer: Self-pay | Admitting: Internal Medicine

## 2023-12-17 DIAGNOSIS — Z0000 Encounter for general adult medical examination without abnormal findings: Secondary | ICD-10-CM | POA: Insufficient documentation

## 2023-12-17 DIAGNOSIS — R1909 Other intra-abdominal and pelvic swelling, mass and lump: Secondary | ICD-10-CM | POA: Insufficient documentation

## 2023-12-17 DIAGNOSIS — F324 Major depressive disorder, single episode, in partial remission: Secondary | ICD-10-CM | POA: Insufficient documentation

## 2023-12-17 NOTE — Assessment & Plan Note (Signed)
 She was advised to continue with trazodone  nightly as needed.  - Adopt regular bedtime routine.

## 2023-12-17 NOTE — Assessment & Plan Note (Signed)
 A growing lump on the right pelvic bone with minimal lymphadenopathy. Differential includes lipoma, hernia, or benign tissue growth. PET scan scheduled for evaluation per Oncology. - Proceed with PET scan - Consider further evaluation if PET scan results are inconclusive.

## 2023-12-17 NOTE — Assessment & Plan Note (Signed)

## 2024-05-18 ENCOUNTER — Other Ambulatory Visit: Payer: Self-pay | Admitting: Internal Medicine

## 2024-05-18 DIAGNOSIS — F5104 Psychophysiologic insomnia: Secondary | ICD-10-CM

## 2024-05-25 ENCOUNTER — Other Ambulatory Visit: Payer: Self-pay

## 2024-05-25 ENCOUNTER — Encounter (HOSPITAL_BASED_OUTPATIENT_CLINIC_OR_DEPARTMENT_OTHER): Payer: Self-pay

## 2024-05-25 ENCOUNTER — Emergency Department (HOSPITAL_BASED_OUTPATIENT_CLINIC_OR_DEPARTMENT_OTHER)
Admission: EM | Admit: 2024-05-25 | Discharge: 2024-05-26 | Disposition: A | Discharge status: Home / Self Care | Attending: Emergency Medicine | Admitting: Emergency Medicine

## 2024-05-25 ENCOUNTER — Emergency Department (HOSPITAL_BASED_OUTPATIENT_CLINIC_OR_DEPARTMENT_OTHER): Admitting: Radiology

## 2024-05-25 DIAGNOSIS — R1013 Epigastric pain: Secondary | ICD-10-CM | POA: Insufficient documentation

## 2024-05-25 DIAGNOSIS — R079 Chest pain, unspecified: Secondary | ICD-10-CM | POA: Diagnosis present

## 2024-05-25 DIAGNOSIS — Z79899 Other long term (current) drug therapy: Secondary | ICD-10-CM | POA: Diagnosis not present

## 2024-05-25 DIAGNOSIS — Z853 Personal history of malignant neoplasm of breast: Secondary | ICD-10-CM | POA: Insufficient documentation

## 2024-05-25 LAB — BASIC METABOLIC PANEL WITH GFR
Anion gap: 12 (ref 5–15)
BUN: 18 mg/dL (ref 6–20)
CO2: 22 mmol/L (ref 22–32)
Calcium: 9.6 mg/dL (ref 8.9–10.3)
Chloride: 106 mmol/L (ref 98–111)
Creatinine, Ser: 1.13 mg/dL — ABNORMAL HIGH (ref 0.44–1.00)
GFR, Estimated: 60 mL/min — ABNORMAL LOW (ref >60)
Glucose, Bld: 99 mg/dL (ref 70–99)
Potassium: 3.8 mmol/L (ref 3.5–5.1)
Sodium: 140 mmol/L (ref 135–145)

## 2024-05-25 LAB — CBC
HCT: 38.9 % (ref 36.0–46.0)
Hemoglobin: 13.1 g/dL (ref 12.0–15.0)
MCH: 29.6 pg (ref 26.0–34.0)
MCHC: 33.7 g/dL (ref 30.0–36.0)
MCV: 87.8 fL (ref 80.0–100.0)
Platelets: 196 K/uL (ref 150–400)
RBC: 4.43 MIL/uL (ref 3.87–5.11)
RDW: 15 % (ref 11.5–15.5)
WBC: 4.9 K/uL (ref 4.0–10.5)
nRBC: 0 % (ref 0.0–0.2)

## 2024-05-25 LAB — TROPONIN T, HIGH SENSITIVITY
Troponin T High Sensitivity: <15 ng/L (ref 0–19)
Troponin T High Sensitivity: <15 ng/L (ref 0–19)

## 2024-05-25 LAB — LIPASE, BLOOD: Lipase: 39 U/L (ref 11–51)

## 2024-05-25 MED ORDER — ALUM & MAG HYDROXIDE-SIMETH 200-200-20 MG/5ML PO SUSP
30.0000 mL | Freq: Once | ORAL | Status: AC
  Administered 2024-05-25: 30 mL via ORAL
  Filled 2024-05-25: qty 30

## 2024-05-25 MED ORDER — FAMOTIDINE 20 MG PO TABS
20.0000 mg | ORAL_TABLET | Freq: Once | ORAL | Status: AC
  Administered 2024-05-25: 20 mg via ORAL
  Filled 2024-05-25: qty 1

## 2024-05-25 NOTE — ED Triage Notes (Signed)
 Pt states that she had sudden onset of mid sternal chest pain 2 hours ago while watching television. Pain described as sharp and intermittent and radiates to right shoulder and under right breast. Pt also reports that she has had diarrhea x 1 day. Denies SOB.

## 2024-05-26 ENCOUNTER — Encounter (HOSPITAL_COMMUNITY): Payer: Self-pay

## 2024-05-26 NOTE — ED Provider Notes (Signed)
 [place] EMERGENCY DEPARTMENT AT [facility] Provider Note   CSN: [id number] Arrival date & time: 05/25/24  2054     Patient presents with: Chest Pain   Kimberly Maldonado is a 48 y.o. female.   The patient is a 48 year old female who presents with intermittent sharp chest pain that began approximately three hours prior to arrival. The pain is centrally located in the chest and occasionally radiates to the neck and breast, lasting about a minute each time. The episodes have occurred four times since arrival, approximately every 15 to 20 minutes. The patient reports associated symptoms of diarrhea earlier in the day, accompanied by nausea but no vomiting. She denies any fever, cough, shortness of breath, or recent changes in appetite, although she has eaten minimally today. She has a significant past medical history of breast cancer, treated with chemotherapy, radiation, and a double mastectomy in 2018, with no complications and regular follow-ups every six months since being cleared in 2019. The patient engages in high-intensity interval training four times a week without recent issues. She has no personal or family history of heart disease, stroke, or blood clots. The patient has been drinking fluids throughout the day and denies any recent travel or sick contacts. History was obtained from the patient.     Chest Pain      Prior to Admission medications   Medication Sig Start Date End Date Taking? Authorizing Provider  acetaminophen  (TYLENOL ) 500 MG tablet take 2 tablets po every 6 hours for 5 days then prn- post operative pain (first dose 6:30 pm today) 09/09/20   Perri Bjork, PA-C  CALCIUM PO Take 1 tablet by mouth daily at 12 noon.    [provider]  cetirizine (ZYRTEC) 10 MG tablet Take 10 mg by mouth daily.    [provider]  Cholecalciferol (VITAMIN D3) 125 MCG (5000 UT) TABS Take 5,000 Units by mouth daily.    [provider]  clobetasol  ointment (TEMOVATE) 0.05 % Apply 1 application topically every other day. Applied to scalp for alopecia 06/28/20   [provider]  exemestane (AROMASIN) 25 MG tablet Take 25 mg by mouth daily. 07/15/18   [provider]  famotidine  (PEPCID ) 20 MG tablet Take 1 tablet (20 mg total) by mouth 2 (two) times daily. Patient not taking: Reported on 11/29/2022 08/12/22   Ethyl Richerd BROCKS, MD  ibuprofen  (ADVIL ) 600 MG tablet take 2 tablets po pc every 6 hours for 5 days for post operative pain (first dose 10:30 pm. today) Patient not taking: Reported on 09/05/2022 09/09/20   Perri Bjork, PA-C  pseudoephedrine-guaifenesin (MUCINEX D) 60-600 MG 12 hr tablet Take 1 tablet by mouth 2 (two) times daily as needed for congestion.    [provider]  traZODone  (DESYREL ) 50 MG tablet TAKE 1 TABLET BY MOUTH EVERYDAY AT BEDTIME 05/20/24   Jarold Medici, MD  tretinoin (RETIN-A) 0.1 % cream Apply 1 application topically daily as needed (acne/skin blemishes.).    [provider]  triamcinolone (KENALOG) 0.1 % Apply 1 application topically 2 (two) times daily as needed (skin irritation.).    [provider]  Triamcinolone Acetonide (KENALOG IJ) Inject 7.5 mg as directed every 8 (eight) weeks. For alopecia    [provider]  valACYclovir (VALTREX) 500 MG tablet TAKE 1 TABLET BY ORAL ROUTE EVERY DAY AS NEEDED Patient taking differently: Take 500 mg by mouth daily as needed (fever blisters.). 06/10/18   Jarold Medici, MD    Allergies: Patient has  no known allergies.    Review of Systems  Cardiovascular:  Positive for chest pain.    Updated Vital Signs BP (!) 130/96 (BP Location: Right Arm)   Pulse 79   Temp 98 F (36.7 C)   Resp 16   LMP 12/08/2016   SpO2 99%   Physical Exam Vitals and nursing note reviewed.  Constitutional:      Appearance: She is well-developed.  HENT:     Head: Normocephalic and atraumatic.  Cardiovascular:     Rate and Rhythm:  Normal rate and regular rhythm.     Heart sounds: No murmur heard. Pulmonary:     Effort: No respiratory distress.     Breath sounds: No stridor. No decreased breath sounds or wheezing.  Abdominal:     General: There is no distension.  Musculoskeletal:     Cervical back: Normal range of motion.  Neurological:     Mental Status: She is alert.     (all labs ordered are listed, but only abnormal results are displayed) Labs Reviewed  BASIC METABOLIC PANEL WITH GFR - Abnormal; Notable for the following components:      Result Value   Creatinine, Ser 1.13 (*)    GFR, Estimated 60 (*)    All other components within normal limits  CBC  LIPASE, BLOOD  TROPONIN T, HIGH SENSITIVITY  TROPONIN T, HIGH SENSITIVITY    EKG: EKG Interpretation Date/Time:  Sunday May 25 2024 21:17:14 EST Ventricular Rate:  72 PR Interval:  126 QRS Duration:  92 QT Interval:  398 QTC Calculation: 435 R Axis:   37  Text Interpretation: Normal sinus rhythm with sinus arrhythmia Normal ECG When compared with ECG of 05-Sep-2022 21:18, PREVIOUS ECG IS PRESENT Confirmed by Lorette Mayo [id number]) on 05/25/2024 11:28:37 PM  Radiology: [facility] Chest 2 View Result Date: 05/25/2024 EXAM: PA AND LATERAL (2) VIEW(S) XRAY OF THE CHEST 05/25/2024 09:53:00 PM COMPARISON: PA and lateral 02/13/2023. Comparison to the prior study reveals no significant interval change. CLINICAL HISTORY: chest pain FINDINGS: LUNGS AND PLEURA: No focal pulmonary opacity. No pleural effusion. No pneumothorax. HEART AND MEDIASTINUM: No acute abnormality of the cardiac and mediastinal silhouettes. BONES AND SOFT TISSUES: There are surgical clips scattered over the anterior chest wall on both sides. No acute osseous abnormality. IMPRESSION: 1. No acute cardiopulmonary process. 2. Stable postoperative changes with surgical clips over the anterior chest wall on both sides. Electronically signed by: Francis Quam MD 05/25/2024 10:10 PM EST RP Workstation:  [id number]     Procedures   Medications Ordered in the ED  alum & mag hydroxide-simeth (MAALOX/MYLANTA) 200-200-20 MG/5ML suspension 30 mL (30 mLs Oral Given 05/25/24 2333)  famotidine  (PEPCID ) tablet 20 mg (20 mg Oral Given 05/25/24 2333)                                    Medical Decision Making Amount and/or Complexity of Data Reviewed Labs: ordered. Radiology: ordered.  Risk OTC drugs.   The patient is a 48 year old with a history of breast cancer, presenting with intermittent sharp chest pain that radiates to the neck, occurring over the past three hours. The patient reports associated symptoms of diarrhea earlier in the day, but denies fever, cough, shortness of breath, or vomiting. The patient has no significant past medical history of heart disease or blood clots and exercises regularly with no recent issues. Family history is notable for two relatives in  their late 86s with significant cardiac events.  The differential diagnosis for this presentation includes acute coronary syndrome (ACS), esophageal spasm, gastroesophageal reflux disease (GERD), pericarditis, and pulmonary embolism. Less likely considerations include pneumonia and musculoskeletal pain. An EKG was performed and reviewed by another clinician, and a chest X-ray was interpreted as normal. Blood tests, including cardiac enzymes, were conducted twice as per protocol to rule out cardiac damage, with the second test reported as fine.  The absence of significant shortness of breath, tachycardia, or hypoxia makes pulmonary embolism less likely. The normal chest X-ray and lack of respiratory symptoms reduce the likelihood of pneumonia. The episodic nature of the pain and its radiation to the neck, along with the patient's recent gastrointestinal symptoms, suggest a potential esophageal spasm or GERD as the cause. However, given the patient's age and family history, ACS cannot be completely ruled out without further cardiac  evaluation.  The plan includes providing the patient with GI medication such as Maalox to address potential esophageal spasm or GERD. The patient will be monitored, and if symptoms persist or worsen, further cardiac evaluation will be considered. The patient will be discharged with instructions to return if symptoms change or new symptoms develop, and a follow-up with their primary care physician is advised for ongoing management and risk assessment.  Patient's troponins are reassuring.  She states that this pain is migrating more towards her stomach further improving the odds of this being GI in nature.  Maalox helped some.  No evidence of ACS, dissection, PE or other emergent causes.  PERC negative.  Will follow-up with PCP if not improving 2 to 3 days.  Discussed the uncertainty of the diagnosis and reasons to return to the emergency room such as worsening symptoms, fever, vomiting, migration of it or new associated symptoms.  All questions answered.  Final diagnoses:  Epigastric pain    ED Discharge Orders     None          Zendayah Hardgrave, Selinda, MD 05/26/24 [phone]

## 2024-06-12 ENCOUNTER — Encounter: Payer: Self-pay | Admitting: Internal Medicine

## 2024-06-12 ENCOUNTER — Ambulatory Visit: Payer: Self-pay | Admitting: Internal Medicine

## 2024-06-12 VITALS — BP 118/78 | HR 67 | Temp 98.3°F | Ht 69.0 in | Wt 186.2 lb

## 2024-06-12 DIAGNOSIS — Z23 Encounter for immunization: Secondary | ICD-10-CM | POA: Diagnosis not present

## 2024-06-12 DIAGNOSIS — F5104 Psychophysiologic insomnia: Secondary | ICD-10-CM

## 2024-06-12 DIAGNOSIS — R1909 Other intra-abdominal and pelvic swelling, mass and lump: Secondary | ICD-10-CM | POA: Diagnosis not present

## 2024-06-12 DIAGNOSIS — R7309 Other abnormal glucose: Secondary | ICD-10-CM | POA: Diagnosis not present

## 2024-06-12 LAB — HEMOGLOBIN A1C
Est. average glucose Bld gHb Est-mCnc: 126 mg/dL
Hgb A1c MFr Bld: 6 % — ABNORMAL HIGH (ref 4.8–5.6)

## 2024-06-12 NOTE — Assessment & Plan Note (Signed)
 Benign soft tissue mass with slight increase in size on recent imaging. Oncology performed PET scan, results suggestive of lipoma.  Oncologist advised continued monitoring. Aware of potential growth and recurrence. - Continue surveillance of soft tissue mass.

## 2024-06-12 NOTE — Assessment & Plan Note (Signed)
 Chronic insomnia managed with trazodone . Interested in reducing dependency. Current regimen includes half a pill with plans to taper. - Continue trazodone  at half a pill. - Taper trazodone  to every other day - Plan to discontinue trazodone  by her next visit.

## 2024-06-12 NOTE — Assessment & Plan Note (Addendum)
 Previous labs reviewed, her A1c has been elevated in the past. I will check an A1c today. Reminded to avoid refined sugars including sugary drinks/foods and processed meats including bacon, sausages and deli meats.

## 2024-06-12 NOTE — Patient Instructions (Signed)
 Insomnia Insomnia is a sleep disorder that makes it difficult to fall asleep or stay asleep. Insomnia can cause fatigue, low energy, difficulty concentrating, mood swings, and poor performance at work or school. There are three different ways to classify insomnia: Difficulty falling asleep. Difficulty staying asleep. Waking up too early in the morning. Any type of insomnia can be long-term (chronic) or short-term (acute). Both are common. Short-term insomnia usually lasts for 3 months or less. Chronic insomnia occurs at least three times a week for longer than 3 months. What are the causes? Insomnia may be caused by another condition, situation, or substance, such as: Having certain mental health conditions, such as anxiety and depression. Using caffeine, alcohol , tobacco, or drugs. Having gastrointestinal conditions, such as gastroesophageal reflux disease (GERD). Having certain medical conditions. These include: Asthma. Alzheimer's disease. Stroke. Chronic pain. An overactive thyroid  gland (hyperthyroidism). Other sleep disorders, such as restless legs syndrome and sleep apnea. Menopause. Sometimes, the cause of insomnia may not be known. What increases the risk? Risk factors for insomnia include: Gender. Females are affected more often than males. Age. Insomnia is more common as people get older. Stress and certain medical and mental health conditions. Lack of exercise. Having an irregular work schedule. This may include working night shifts and traveling between different time zones. What are the signs or symptoms? If you have insomnia, the main symptom is having trouble falling asleep or having trouble staying asleep. This may lead to other symptoms, such as: Feeling tired or having low energy. Feeling nervous about going to sleep. Not feeling rested in the morning. Having trouble concentrating. Feeling irritable, anxious, or depressed. How is this diagnosed? This condition  may be diagnosed based on: Your symptoms and medical history. Your health care provider may ask about: Your sleep habits. Any medical conditions you have. Your mental health. A physical exam. How is this treated? Treatment for insomnia depends on the cause. Treatment may focus on treating an underlying condition that is causing the insomnia. Treatment may also include: Medicines to help you sleep. Counseling or therapy. Lifestyle adjustments to help you sleep better. Follow these instructions at home: Eating and drinking  Limit or avoid alcohol , caffeinated beverages, and products that contain nicotine and tobacco, especially close to bedtime. These can disrupt your sleep. Do not eat a large meal or eat spicy foods right before bedtime. This can lead to digestive discomfort that can make it hard for you to sleep. Sleep habits  Keep a sleep diary to help you and your health care provider figure out what could be causing your insomnia. Write down: When you sleep. When you wake up during the night. How well you sleep and how rested you feel the next day. Any side effects of medicines you are taking. What you eat and drink. Make your bedroom a dark, comfortable place where it is easy to fall asleep. Put up shades or blackout curtains to block light from outside. Use a white noise machine to block noise. Keep the temperature cool. Limit screen use before bedtime. This includes: Not watching TV. Not using your smartphone, tablet, or computer. Stick to a routine that includes going to bed and waking up at the same times every day and night. This can help you fall asleep faster. Consider making a quiet activity, such as reading, part of your nighttime routine. Try to avoid taking naps during the day so that you sleep better at night. Get out of bed if you are still awake after  15 minutes of trying to sleep. Keep the lights down, but try reading or doing a quiet activity. When you feel  sleepy, go back to bed. General instructions Take over-the-counter and prescription medicines only as told by your health care provider. Exercise regularly as told by your health care provider. However, avoid exercising in the hours right before bedtime. Use relaxation techniques to manage stress. Ask your health care provider to suggest some techniques that may work well for you. These may include: Breathing exercises. Routines to release muscle tension. Visualizing peaceful scenes. Make sure that you drive carefully. Do not drive if you feel very sleepy. Keep all follow-up visits. This is important. Contact a health care provider if: You are tired throughout the day. You have trouble in your daily routine due to sleepiness. You continue to have sleep problems, or your sleep problems get worse. Get help right away if: You have thoughts about hurting yourself or someone else. Get help right away if you feel like you may hurt yourself or others, or have thoughts about taking your own life. Go to your nearest emergency room or: Call 911. Call the National Suicide Prevention Lifeline at 2232757840 or 988. This is open 24 hours a day. Text the Crisis Text Line at 657-529-4371. Summary Insomnia is a sleep disorder that makes it difficult to fall asleep or stay asleep. Insomnia can be long-term (chronic) or short-term (acute). Treatment for insomnia depends on the cause. Treatment may focus on treating an underlying condition that is causing the insomnia. Keep a sleep diary to help you and your health care provider figure out what could be causing your insomnia. This information is not intended to replace advice given to you by your health care provider. Make sure you discuss any questions you have with your health care provider. Document Revised: 06/06/2021 Document Reviewed: 06/06/2021 Elsevier Patient Education  2024 [employer].

## 2024-06-12 NOTE — Progress Notes (Signed)
 I,Kimberly Maldonado, CMA,acting as a neurosurgeon for Kimberly LOISE Slocumb, Maldonado.,have documented all relevant documentation on the behalf of Kimberly LOISE Slocumb, Maldonado,as directed by  Kimberly LOISE Slocumb, Maldonado while in the presence of Kimberly LOISE Slocumb, Maldonado.  Subjective:  Patient ID: Kimberly Maldonado , female    DOB: 29-Jan-1976 , 48 y.o.   MRN: [id number]  Chief Complaint  Patient presents with   Med Check    Patient presents today for Trazodone  follow up. She reports compliance with medication. She has no specific questions or concerns.     HPI Discussed the use of AI scribe software for clinical note transcription with the patient, who gave verbal consent to proceed.  History of Present Illness Kimberly Maldonado is a 47 year old female who presents for follow-up of insomnia.  She has been taking trazodone  for several years to manage her insomnia, which she finds effective. She wants to try sleeping without medication and has reduced her dose to half a pill, considering taking it every other day. She recently picked up a refill two weeks ago. Her bedtime routine includes going to the gym on some days, having dinner, watching TV, and showering before bed, which she finds helps her sleep better. On non-gym days, her routine is similar, with dinner before 8 PM and some downtime watching TV before bed.  She visited the emergency room on November 16th due to chest pain, which she attributes to a stomach bug and not eating for over 24 hours. She was given Pepcid  in the ER, which she does not take regularly. She mentions that she goes to the ER whenever she experiences chest pain due to her good insurance coverage.  She has a history of a growing lipoma, which has been monitored with ultrasounds and a PET scan. Her oncologist is monitoring it as it has become more circumscribed and slightly larger over time.  She is currently taking a multivitamin and uses Retin A and Kenalog as needed. She is no longer taking famotidine  (Pepcid ). She  has been eating better since her daughter left and is planning a trip to [place] for Christmas as part of focusing on experiences over gifts.   HPI   Past Medical History:  Diagnosis Date   Allergy    takes Zyrtec and Singulair daily   Anxiety    Breast cancer (HCC) 11/15/2016   right breast CHEMO AND RAIATION DONE   Carpal tunnel syndrome    COVID 08/09/2020   asymptomatic   Depression    takes Cymbalta daily   Genetic testing 12/13/2016   Ms. Mckenny underwent genetic counseling and testing for hereditary cancer syndromes on 11/23/2016.Testing was performed through a custom panel that combined [employer]'s Common Hereditary Cancers Panel with [employer]'s Thyroid Cancer Panel. This custom panel includes analysis of the following 48 genes: APC, ATM, AXIN2, BARD1, BMPR1A, BRCA1, BRCA2, BRIP1, CDH1, CDKN2A, CHEK2, CTNNA1, DICER1, EPCAM, GREM1   Insomnia    takes Trazodone  and Ambien  nightly   Kidney donor    Lymphedema of right arm    HX OF      Family History  Problem Relation Age of Onset   COPD Maternal Grandmother    Hypertension Maternal Grandfather    Prostate cancer Maternal Grandfather 42   Bone cancer Maternal Grandfather 68   Diabetes Mother    Breast cancer Maternal Aunt 54   Thyroid cancer Other 40       d.40s     Current Outpatient Medications:  acetaminophen  (TYLENOL ) 500 MG tablet, take 2 tablets po every 6 hours for 5 days then prn- post operative pain (first dose 6:30 pm today), Disp: 30 tablet, Rfl: 1   CALCIUM PO, Take 1 tablet by mouth daily at 12 noon., Disp: , Rfl:    cetirizine (ZYRTEC) 10 MG tablet, Take 10 mg by mouth daily., Disp: , Rfl:    Cholecalciferol (VITAMIN D3) 125 MCG (5000 UT) TABS, Take 5,000 Units by mouth daily., Disp: , Rfl:    clobetasol ointment (TEMOVATE) 0.05 %, Apply 1 application topically every other day. Applied to scalp for alopecia, Disp: , Rfl:    exemestane (AROMASIN) 25 MG tablet, Take 25 mg by mouth daily., Disp: , Rfl:     pseudoephedrine-guaifenesin (MUCINEX D) 60-600 MG 12 hr tablet, Take 1 tablet by mouth 2 (two) times daily as needed for congestion., Disp: , Rfl:    traZODone  (DESYREL ) 50 MG tablet, TAKE 1 TABLET BY MOUTH EVERYDAY AT BEDTIME, Disp: 90 tablet, Rfl: 1   tretinoin (RETIN-A) 0.1 % cream, Apply 1 application topically daily as needed (acne/skin blemishes.)., Disp: , Rfl:    triamcinolone (KENALOG) 0.1 %, Apply 1 application topically 2 (two) times daily as needed (skin irritation.)., Disp: , Rfl:    Triamcinolone Acetonide (KENALOG IJ), Inject 7.5 mg as directed every 8 (eight) weeks. For alopecia, Disp: , Rfl:    valACYclovir (VALTREX) 500 MG tablet, TAKE 1 TABLET BY ORAL ROUTE EVERY DAY AS NEEDED, Disp: 30 tablet, Rfl: 0   ibuprofen  (ADVIL ) 600 MG tablet, take 2 tablets po pc every 6 hours for 5 days for post operative pain (first dose 10:30 pm. today) (Patient not taking: Reported on 06/12/2024), Disp: 30 tablet, Rfl: 1   No Known Allergies   Review of Systems  Constitutional: Negative.   Respiratory: Negative.    Cardiovascular: Negative.   Gastrointestinal: Negative.   Neurological: Negative.   Psychiatric/Behavioral: Negative.       Today's Vitals   06/12/24 0830  BP: 118/78  Pulse: 67  Temp: 98.3 F (36.8 C)  SpO2: 98%  Weight: 186 lb 3.2 oz (84.5 kg)  Height: 5' 9 (1.753 m)   Body mass index is 27.5 kg/m.  Wt Readings from Last 3 Encounters:  06/12/24 186 lb 3.2 oz (84.5 kg)  12/12/23 189 lb 12.8 oz (86.1 kg)  10/10/23 191 lb 3.2 oz (86.7 kg)    The ASCVD Risk score (Arnett DK, et al., 2019) failed to calculate for the following reasons:   The valid total cholesterol range is 130 to 320 mg/dL  Objective:  Physical Exam Vitals and nursing note reviewed.  Constitutional:      Appearance: Normal appearance.  HENT:     Head: Normocephalic and atraumatic.  Eyes:     Extraocular Movements: Extraocular movements intact.  Cardiovascular:     Rate and Rhythm: Normal rate  and regular rhythm.     Heart sounds: Normal heart sounds.  Pulmonary:     Effort: Pulmonary effort is normal.     Breath sounds: Normal breath sounds.  Musculoskeletal:     Cervical back: Normal range of motion.  Skin:    General: Skin is warm.  Neurological:     General: No focal deficit present.     Mental Status: She is alert.  Psychiatric:        Mood and Affect: Mood normal.        Behavior: Behavior normal.      Assessment And Plan:  Assessment & Plan Chronic insomnia Chronic insomnia managed with trazodone . Interested in reducing dependency. Current regimen includes half a pill with plans to taper. - Continue trazodone  at half a pill. - Taper trazodone  to every other day - Plan to discontinue trazodone  by her next visit. Elevated hemoglobin A1c Previous labs reviewed, her A1c has been elevated in the past. I will check an A1c today. Reminded to avoid refined sugars including sugary drinks/foods and processed meats including bacon, sausages and deli meats.   Groin swelling Benign soft tissue mass with slight increase in size on recent imaging. Oncology performed PET scan, results suggestive of lipoma.  Oncologist advised continued monitoring. Aware of potential growth and recurrence. - Continue surveillance of soft tissue mass. Immunization due She was given flu vaccine.     Orders Placed This Encounter  Procedures   Flu vaccine trivalent PF, 6mos and older(Flulaval,Afluria,Fluarix,Fluzone)   Hemoglobin A1c     Return if symptoms worsen or fail to improve.  Patient was given opportunity to ask questions. Patient verbalized understanding of the plan and was able to repeat key elements of the plan. All questions were answered to their satisfaction.    I, Kimberly LOISE Slocumb, Maldonado, have reviewed all documentation for this visit. The documentation on 06/12/24 for the exam, diagnosis, procedures, and orders are all accurate and complete.   IF YOU HAVE BEEN REFERRED TO A  SPECIALIST, IT MAY TAKE 1-2 WEEKS TO SCHEDULE/PROCESS THE REFERRAL. IF YOU HAVE NOT HEARD FROM US /SPECIALIST IN TWO WEEKS, PLEASE GIVE US  A CALL AT [phone] X [phone].

## 2024-06-16 ENCOUNTER — Ambulatory Visit: Payer: Self-pay | Admitting: Internal Medicine

## 2024-08-14 ENCOUNTER — Encounter: Payer: Self-pay | Admitting: Internal Medicine

## 2024-08-15 ENCOUNTER — Other Ambulatory Visit: Payer: Self-pay | Admitting: Internal Medicine

## 2024-12-18 ENCOUNTER — Encounter: Payer: Self-pay | Admitting: Internal Medicine
